# Patient Record
Sex: Female | Born: 1990 | Race: Asian | Hispanic: No | Marital: Single | Smoking: Never smoker
Health system: Southern US, Community
[De-identification: ages and names within clinical notes are randomized; demographics above are authoritative.]

## PROBLEM LIST (undated history)

## (undated) DIAGNOSIS — K219 Gastro-esophageal reflux disease without esophagitis: Secondary | ICD-10-CM

## (undated) DIAGNOSIS — K589 Irritable bowel syndrome without diarrhea: Secondary | ICD-10-CM

## (undated) DIAGNOSIS — Z789 Other specified health status: Secondary | ICD-10-CM

## (undated) DIAGNOSIS — R011 Cardiac murmur, unspecified: Secondary | ICD-10-CM

## (undated) DIAGNOSIS — Z98891 History of uterine scar from previous surgery: Secondary | ICD-10-CM

## (undated) HISTORY — DX: Irritable bowel syndrome, unspecified: K58.9

## (undated) HISTORY — PX: WISDOM TOOTH EXTRACTION: SHX21

## (undated) HISTORY — DX: Gastro-esophageal reflux disease without esophagitis: K21.9

## (undated) HISTORY — DX: Cardiac murmur, unspecified: R01.1

## (undated) HISTORY — PX: NO PAST SURGERIES: SHX2092

---

## 2012-02-19 ENCOUNTER — Ambulatory Visit (INDEPENDENT_AMBULATORY_CARE_PROVIDER_SITE_OTHER): Payer: Medicaid Other

## 2012-02-19 DIAGNOSIS — Z331 Pregnant state, incidental: Secondary | ICD-10-CM

## 2012-02-19 NOTE — Progress Notes (Signed)
New OB interview completed. New ob work up to be scheduled. Pt c/o some nausea but able to keep down some food.  Encouraged pt to eat small Meals throughout the day.   Chem 10 SG 1.015 PH 6 Protein Trace

## 2012-02-20 LAB — PRENATAL PANEL VII
HCT: 36.3 % (ref 36.0–46.0)
HIV: NONREACTIVE
Hemoglobin: 12.8 g/dL (ref 12.0–15.0)
Hepatitis B Surface Ag: NEGATIVE
Lymphocytes Relative: 24 % (ref 12–46)
Lymphs Abs: 1.5 10*3/uL (ref 0.7–4.0)
MCHC: 35.3 g/dL (ref 30.0–36.0)
Monocytes Absolute: 0.4 10*3/uL (ref 0.1–1.0)
Monocytes Relative: 6 % (ref 3–12)
Neutro Abs: 4.5 10*3/uL (ref 1.7–7.7)
Neutrophils Relative %: 70 % (ref 43–77)
RBC: 4.38 MIL/uL (ref 3.87–5.11)
Rh Type: POSITIVE
Rubella: 1.13 Index — ABNORMAL HIGH (ref <0.90)
WBC: 6.4 10*3/uL (ref 4.0–10.5)

## 2012-02-21 LAB — HEMOGLOBINOPATHY EVALUATION
Hgb A2 Quant: 2.4 % (ref 2.2–3.2)
Hgb F Quant: 0 % (ref 0.0–2.0)
Hgb S Quant: 0 %

## 2012-02-21 LAB — CULTURE, OB URINE: Colony Count: >=100000

## 2012-02-27 ENCOUNTER — Other Ambulatory Visit: Payer: Self-pay

## 2012-02-27 DIAGNOSIS — N390 Urinary tract infection, site not specified: Secondary | ICD-10-CM

## 2012-02-27 MED ORDER — CEPHALEXIN 500 MG PO CAPS: 500.0000 mg | ORAL_CAPSULE | Freq: Two times a day (BID) | ORAL | Status: DC

## 2012-03-18 ENCOUNTER — Encounter: Payer: Medicaid Other | Admitting: Obstetrics and Gynecology

## 2012-03-27 ENCOUNTER — Encounter: Payer: Medicaid Other | Admitting: Obstetrics and Gynecology

## 2012-04-02 ENCOUNTER — Telehealth: Payer: Self-pay | Admitting: Obstetrics and Gynecology

## 2012-04-02 NOTE — Telephone Encounter (Signed)
Pt called, is 19 weeks, states she has been having a pain on the right side of her abdomen since last pm.  Pt denies any bldg.  Today the pain is a little better but it is still there.  Pt admits to only drinking 1 bottle of water per day.  Pt advised that she needs to drink a minimum of 4 bottles or water per day or a minimum of 64 oz of water, whichever way she can get that in, take some Tylenol, try using a heating pad on a low setting, take a warm bath and call back if no relief.

## 2012-04-25 ENCOUNTER — Ambulatory Visit: Payer: Medicaid Other | Admitting: Certified Nurse Midwife

## 2012-04-25 ENCOUNTER — Encounter: Payer: Self-pay | Admitting: Certified Nurse Midwife

## 2012-04-25 VITALS — BP 90/58 | Ht 61.0 in | Wt 94.0 lb

## 2012-04-25 DIAGNOSIS — O2340 Unspecified infection of urinary tract in pregnancy, unspecified trimester: Secondary | ICD-10-CM | POA: Insufficient documentation

## 2012-04-25 DIAGNOSIS — Z3689 Encounter for other specified antenatal screening: Secondary | ICD-10-CM

## 2012-04-25 DIAGNOSIS — O2342 Unspecified infection of urinary tract in pregnancy, second trimester: Secondary | ICD-10-CM

## 2012-04-25 DIAGNOSIS — O0932 Supervision of pregnancy with insufficient antenatal care, second trimester: Secondary | ICD-10-CM

## 2012-04-25 DIAGNOSIS — O0930 Supervision of pregnancy with insufficient antenatal care, unspecified trimester: Secondary | ICD-10-CM | POA: Insufficient documentation

## 2012-04-25 DIAGNOSIS — Z331 Pregnant state, incidental: Secondary | ICD-10-CM

## 2012-04-25 LAB — POCT WET PREP (WET MOUNT)
Bacteria Wet Prep HPF POC: NEGATIVE
Clue Cells Wet Prep Whiff POC: NEGATIVE

## 2012-04-25 LAB — OB RESULTS CONSOLE GC/CHLAMYDIA: Chlamydia: NEGATIVE

## 2012-04-25 LAB — POCT URINALYSIS DIPSTICK
Ketones, UA: NEGATIVE
Leukocytes, UA: NEGATIVE
Protein, UA: NEGATIVE
Spec Grav, UA: 1.01
Urobilinogen, UA: NEGATIVE
pH, UA: 6

## 2012-04-25 MED ORDER — CITRANATAL ASSURE 35-1 & 300 MG PO MISC: 1.0000 | Freq: Every day | ORAL | Status: DC

## 2012-04-25 NOTE — Progress Notes (Signed)
[redacted]w[redacted]d Pt declines genetic screenings and flu shot Pt wants rx PNV's Citranatal Assure GC/CT and wet prep due today

## 2012-04-25 NOTE — Progress Notes (Signed)
Lindsey Hess is being seen today for her first obstetrical visit at [redacted]w[redacted]d gestation by LMP.  She reports early N/V which has not resolved.  Pt had a pos urine culture at NOB interview (1/7); rx done but pt did not know about it.  Will re-culture urine today and treat as appropriate.  Pt stated her desire for an epidural in labor.  Her obstetrical history is significant for: Patient Active Problem List  Diagnosis  . Late prenatal care  . 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  The following portions of the patient's history were reviewed and updated as appropriate: allergies, current medications, past family history, past medical history, past social history, past surgical history and problem list.  Review of Systems Pertinent ROS is described in HPI   Objective:   BP 90/58  Ht 5\' 1"  (1.549 m)  Wt 94 lb (42.638 kg)  BMI 17.77 kg/m2  LMP 11/19/2011 Wt Readings from Last 1 Encounters:  04/25/12 94 lb (42.638 kg)   BMI: Body mass index is 17.77 kg/(m^2).  General: alert, cooperative and no distress HEENT: grossly normal  Thyroid: normal  Respiratory: clear to auscultation bilaterally Cardiovascular: regular rate and rhythm,  Breasts:  No dominant masses, nipples erect Gastrointestinal: soft, non-tender; no masses,  no organomegaly Extremities: extremities normal, no pain or edema Vaginal Bleeding: None  EXTERNAL GENITALIA: normal appearing vulva with no masses, tenderness or lesions VAGINA: no abnormal discharge or lesions CERVIX: no lesions or cervical motion tenderness; cervix closed, long, firm UTERUS: gravid and consistent with 22 weeks ADNEXA: no masses palpable and nontender OB EXAM PELVIMETRY: appears adequate  FHR:  150  bpm  Assessment:   Pregnancy at   [redacted]w[redacted]d Late entry to care - NOB interview at 13 weeks but could not get NOB w/u until 22 weeks d/t medicaid difficulties UTI at NOB interview not treated - re-cultured today  Plan:   Prenatal panel reviewed and discussed with the patient:  yes  Pap smear collected:  Under 29 yo GC/Chlamydia collected:  yes Wet prep:  Neg  Discussion of Genetic testing options: Declines Prenatal vitamins recommended  Next visit:  4 weeks for ROB and glucola Other anticipated f/u:   Anatomy U/S to be scheduled as soon as possible   Haroldine Laws, CNM, MSN

## 2012-04-26 LAB — GC/CHLAMYDIA PROBE AMP
CT Probe RNA: NEGATIVE
GC Probe RNA: NEGATIVE

## 2012-04-27 LAB — CULTURE, OB URINE
Colony Count: NO GROWTH
Organism ID, Bacteria: NO GROWTH

## 2012-08-14 ENCOUNTER — Telehealth (HOSPITAL_COMMUNITY): Payer: Self-pay | Admitting: *Deleted

## 2012-08-14 ENCOUNTER — Encounter (HOSPITAL_COMMUNITY): Payer: Self-pay | Admitting: *Deleted

## 2012-08-14 NOTE — Telephone Encounter (Signed)
Preadmission screen  

## 2012-08-19 ENCOUNTER — Inpatient Hospital Stay (HOSPITAL_COMMUNITY)
Admission: RE | Admit: 2012-08-19 | Discharge: 2012-08-19 | DRG: 782 | Disposition: A | Discharge status: Home / Self Care | Payer: Medicaid Other | Source: Ambulatory Visit | Attending: Obstetrics and Gynecology | Admitting: Obstetrics and Gynecology

## 2012-08-19 ENCOUNTER — Encounter (HOSPITAL_COMMUNITY): Payer: Self-pay

## 2012-08-19 VITALS — BP 123/83 | HR 71 | Ht 61.0 in | Wt 106.0 lb

## 2012-08-19 DIAGNOSIS — IMO0001 Reserved for inherently not codable concepts without codable children: Secondary | ICD-10-CM | POA: Diagnosis not present

## 2012-08-19 DIAGNOSIS — O0932 Supervision of pregnancy with insufficient antenatal care, second trimester: Secondary | ICD-10-CM

## 2012-08-19 DIAGNOSIS — O321XX0 Maternal care for breech presentation, not applicable or unspecified: Secondary | ICD-10-CM | POA: Diagnosis present

## 2012-08-19 DIAGNOSIS — O2342 Unspecified infection of urinary tract in pregnancy, second trimester: Secondary | ICD-10-CM

## 2012-08-19 MED ORDER — TERBUTALINE SULFATE 1 MG/ML IJ SOLN
INTRAMUSCULAR | Status: AC
  Filled 2012-08-19: qty 1

## 2012-08-19 MED ORDER — LACTATED RINGERS IV SOLN: INTRAVENOUS | Status: DC

## 2012-08-19 MED ORDER — TERBUTALINE SULFATE 1 MG/ML IJ SOLN
0.2500 mg | Freq: Once | INTRAMUSCULAR | Status: AC
  Administered 2012-08-19: 0.25 mg via SUBCUTANEOUS

## 2012-08-19 NOTE — H&P (Addendum)
Admission History and Physical Exam for an Obstetrics Patient  Lindsey Hess is a 22 y.o. female, G1P0, at [redacted]w[redacted]d gestation, who presents for external version. She has been followed at the [facility] and Gynecology division of [facility].  Her pregnancy has been complicated by breech presentation. See history below.  OB History   Grav Para Term Preterm Abortions TAB SAB Ect Mult Living   1               No past medical history on file.  Prescriptions prior to admission  Medication Sig Dispense Refill  . cephALEXin (KEFLEX) 500 MG capsule Take 1 capsule (500 mg total) by mouth 2 (two) times daily.  14 capsule  0  . Pediatric Multiple Vit-C-FA (FLINSTONES GUMMIES OMEGA-3 DHA PO) Take by mouth.      . Prenat w/o A-FeCbGl-DSS-FA-DHA (CITRANATAL ASSURE) 35-1 & 300 MG tablet Take 1 tablet by mouth daily.  30 tablet  12    Past Surgical History  Procedure Laterality Date  . Wisdom tooth extraction      No Known Allergies  Family History: family history includes Cancer in her maternal grandmother and Diabetes in her maternal grandmother.  Social History:  reports that she has never smoked. She has never used smokeless tobacco. She reports that she does not drink alcohol or use illicit drugs.  Review of systems: Normal pregnancy complaints.  Admission Physical Exam:    There is no weight on file to calculate BMI.  Last menstrual period 11/19/2011.  HEENT:                 Within normal limits Chest:                   Clear Heart:                    Regular rate and rhythm Abdomen:             Gravid and nontender Extremities:          Grossly normal Neurologic exam: Grossly normal Pelvic exam:         Cervix: closed when last checked  Prenatal labs: ABO, Rh:             O/POS/-- (01/07 1405) HBsAg:                 NEGATIVE (01/07 1405)  HIV:                       NON REACTIVE (01/07 1405)  GBS:                     Negative (07/03  0000) Antibody:              NEG (01/07 1405) Rubella:                  RPR:                    NON REAC (01/07 1405)      Assessment:  [redacted]w[redacted]d gestation  Breech presentation  Plan:  External version. Risks and benefits discussed.   Sheritha Louis V 08/19/2012, 8:12 AM

## 2012-08-19 NOTE — Op Note (Signed)
OPERATIVE NOTE  Lindsey Hess  DOB:    [birth year]  MRN:    [id number]  CSN:    [id number]  Date of Surgery:  08/19/2012  Preoperative Diagnosis:  [redacted]w[redacted]d  Breech Presentation  Postoperative Diagnosis:  [redacted]w[redacted]d  Breech Presentation  Procedure:  Unsuccessful External Version  Surgeon:  Leonard Schwartz, M.D.  Assistant:  Haroldine Laws, CNM  Anesthetic:  None  Disposition:   The patient presents with an infant with a breech presentation documented by ultrasound. She denies bleeding and leakage of fluid. The treatment options were reviewed including observation, external version, and cesarean delivery. The risk and benefits were discussed. Questions were answered. The patient wants to proceed with an attempt at external version. She understands that fetal stress is a possibility, and that we may need to proceed today with cesarean delivery. She understands the indications for the procedure. She accepts the risk of, but not limited to, anesthetic complications, bleeding, infections, and possible damage to the surrounding organs.  Findings:  An ultrasound was performed and a breech presentation was confirmed. The amniotic fluid volume was normal. Normal fetal heart motion was documented. There was no evidence of an abruption. The fetal head is located in the RU quadrant. The patient's blood type is O+.  Procedure:  The patient was evaluated on the labor and delivery suite. The fetal heart rate tracing is a category 1. The procedure was carefully explained to the patient and her family members. The risk and benefits were reviewed. A permit was signed. The patient had a saline lock started. The patient was given 0.25 mg of subcutaneous terbutaline. An ultrasound was performed with findings as mentioned above. The patient's abdomen was covered with ultrasound gel. Two attempts were made to rotate the infant in a counterclockwise direction. These attempts were unsuccessful. One  attempt was made to rotate the infant in a clockwise direction. This attempt was also unsuccessful. The decision was made to discontinue any further attempts. The patient was monitored carefully with the ultrasound throughout her procedure. The was no evidence of fetal stress at any time. The patient was placed back on the nonstress test monitor for observation.   We will plan to schedule a cesarean delivery.  Leonard Schwartz, M.D.

## 2012-08-22 NOTE — Discharge Summary (Signed)
Discharge Summary for [facility] Obstetrical Patient  Lindsey Hess  DOB:    07/29/1990 MRN:    [id number] CSN:    [id number]  Date of admission:                  08/19/12  Date of discharge:                   08/19/12  Admission Diagnosis:  37w 5d gestation  Breech presentation  Discharge Diagnosis:  37w 5d gestation  Breech presentation  Procedures this admission:  Unsuccessful External Version  History of Present Illness:  Lindsey Hess is a 22 y.o. female, G1P0, who presents at [redacted]w[redacted]d weeks gestation. The patient has been followed at the [facility] and Gynecology division of [facility]. She was admitted for external version. Her pregnancy has been complicated by: a breech presentation at term.  Hospital course:  The patient was admitted for an ultrasound that confirmed a breech presentation and then an external version that was unsuccessful.   Her NST was reactive after her procedure. She was discharged to home doing well.  Discharge Physical Exam:  BP 123/83  Pulse 71  Ht 5\' 1"  (1.549 m)  Wt 106 lb (48.081 kg)  BMI 20.04 kg/m2  LMP 11/19/2011   General: no distress Resp: clear to auscultation bilaterally Cardio: regular rate and rhythm, S1, S2 normal, no murmur, click, rub or gallop GI: soft, non-tender; bowel sounds normal; no masses,  no organomegaly and Breech presentation confirmed.  Exam deferred.  Discharge Information:  Activity:           unrestricted Diet:                routine Medications: PNV and Tylenol as needed. Condition:      stable Instructions:  Call for problems Discharge to: home  Follow-up Information   Follow up with Janine Limbo, MD. (Please keep next appt, office will call to schedule c-section)    Contact information:   [address] [place] [address] [phone]        Janine Limbo 08/22/2012

## 2012-08-25 ENCOUNTER — Other Ambulatory Visit: Payer: Self-pay | Admitting: Obstetrics and Gynecology

## 2012-08-25 ENCOUNTER — Inpatient Hospital Stay (HOSPITAL_COMMUNITY): Admission: AD | Admit: 2012-08-25 | Payer: Self-pay | Source: Ambulatory Visit | Admitting: Obstetrics and Gynecology

## 2012-08-26 ENCOUNTER — Encounter (HOSPITAL_COMMUNITY): Payer: Self-pay

## 2012-08-26 ENCOUNTER — Encounter (HOSPITAL_COMMUNITY)
Admission: RE | Admit: 2012-08-26 | Discharge: 2012-08-26 | Disposition: A | Discharge status: Home / Self Care | Payer: Medicaid Other | Source: Ambulatory Visit | Attending: Obstetrics and Gynecology | Admitting: Obstetrics and Gynecology

## 2012-08-26 HISTORY — DX: Other specified health status: Z78.9

## 2012-08-26 LAB — CBC
Hemoglobin: 13.1 g/dL (ref 12.0–15.0)
Platelets: 202 10*3/uL (ref 150–400)
RBC: 4.37 MIL/uL (ref 3.87–5.11)
WBC: 7.1 10*3/uL (ref 4.0–10.5)

## 2012-08-26 LAB — TYPE AND SCREEN
ABO/RH(D): O POS
Antibody Screen: NEGATIVE

## 2012-08-26 LAB — ABO/RH: ABO/RH(D): O POS

## 2012-08-26 LAB — RPR: RPR Ser Ql: NONREACTIVE

## 2012-08-26 NOTE — Patient Instructions (Addendum)
21 Lindsey Hess  08/26/2012   Your procedure is scheduled on:  08/28/12  Enter through the Main Entrance of [facility] at 915 AM.  Pick up the phone at the desk and dial [phone].   Call this number if you have problems the morning of surgery: [phone]   Remember:   Do not eat food:After Midnight.  Do not drink clear liquids: After Midnight.  Take these medicines the morning of surgery with A SIP OF WATER: NA   Do not wear jewelry, make-up or nail polish.  Do not wear lotions, powders, or perfumes. You may wear deodorant.  Do not shave 48 hours prior to surgery.  Do not bring valuables to the hospital.  [facility] is not responsible                  for any belongings or valuables brought to the hospital.  Contacts, dentures or bridgework may not be worn into surgery.  Leave suitcase in the car. After surgery it may be brought to your room.  For patients admitted to the hospital, checkout time is 11:00 AM the day of                discharge.   Patients discharged the day of surgery will not be allowed to drive                   home.  Name and phone number of your driver: NA  Special Instructions: Shower using CHG 2 nights before surgery and the night before surgery.  If you shower the day of surgery use CHG.  Use special wash - you have one bottle of CHG for all showers.  You should use approximately 1/3 of the bottle for each shower.   Please read over the following fact sheets that you were given: Surgical Site Infection Prevention

## 2012-08-28 ENCOUNTER — Encounter (HOSPITAL_COMMUNITY)
Admission: RE | Disposition: A | Discharge status: Home / Self Care | Payer: Self-pay | Source: Ambulatory Visit | Attending: Obstetrics and Gynecology

## 2012-08-28 ENCOUNTER — Inpatient Hospital Stay (HOSPITAL_COMMUNITY): Payer: Medicaid Other | Admitting: Anesthesiology

## 2012-08-28 ENCOUNTER — Encounter (HOSPITAL_COMMUNITY): Payer: Self-pay | Admitting: Anesthesiology

## 2012-08-28 ENCOUNTER — Inpatient Hospital Stay (HOSPITAL_COMMUNITY)
Admission: RE | Admit: 2012-08-28 | Discharge: 2012-08-31 | DRG: 766 | Disposition: A | Discharge status: Home / Self Care | Payer: Medicaid Other | Source: Ambulatory Visit | Attending: Obstetrics and Gynecology | Admitting: Obstetrics and Gynecology

## 2012-08-28 DIAGNOSIS — D649 Anemia, unspecified: Secondary | ICD-10-CM | POA: Diagnosis not present

## 2012-08-28 DIAGNOSIS — O0931 Supervision of pregnancy with insufficient antenatal care, first trimester: Secondary | ICD-10-CM

## 2012-08-28 DIAGNOSIS — O9903 Anemia complicating the puerperium: Secondary | ICD-10-CM | POA: Diagnosis not present

## 2012-08-28 DIAGNOSIS — Z98891 History of uterine scar from previous surgery: Secondary | ICD-10-CM

## 2012-08-28 DIAGNOSIS — O321XX0 Maternal care for breech presentation, not applicable or unspecified: Principal | ICD-10-CM | POA: Diagnosis present

## 2012-08-28 HISTORY — DX: History of uterine scar from previous surgery: Z98.891

## 2012-08-28 SURGERY — Surgical Case
Anesthesia: Spinal | Site: Abdomen | Wound class: Clean Contaminated

## 2012-08-28 MED ORDER — DIPHENHYDRAMINE HCL 25 MG PO CAPS
25.0000 mg | ORAL_CAPSULE | Freq: Four times a day (QID) | ORAL | Status: DC | PRN
  Filled 2012-08-28: qty 1

## 2012-08-28 MED ORDER — FENTANYL CITRATE 0.05 MG/ML IJ SOLN
INTRAMUSCULAR | Status: DC | PRN
  Administered 2012-08-28: 12.5 ug via INTRAVENOUS
  Administered 2012-08-28: 12.5 ug via INTRATHECAL

## 2012-08-28 MED ORDER — EPHEDRINE 5 MG/ML INJ
INTRAVENOUS | Status: AC
  Filled 2012-08-28: qty 10

## 2012-08-28 MED ORDER — PROMETHAZINE HCL 25 MG/ML IJ SOLN: 6.2500 mg | INTRAMUSCULAR | Status: DC | PRN

## 2012-08-28 MED ORDER — SENNOSIDES-DOCUSATE SODIUM 8.6-50 MG PO TABS
2.0000 | ORAL_TABLET | Freq: Every day | ORAL | Status: DC
  Administered 2012-08-28 – 2012-08-30 (×3): 2 via ORAL

## 2012-08-28 MED ORDER — MEPERIDINE HCL 25 MG/ML IJ SOLN: 6.2500 mg | INTRAMUSCULAR | Status: DC | PRN

## 2012-08-28 MED ORDER — DIPHENHYDRAMINE HCL 50 MG/ML IJ SOLN: 25.0000 mg | INTRAMUSCULAR | Status: DC | PRN

## 2012-08-28 MED ORDER — ACETAMINOPHEN 10 MG/ML IV SOLN
1000.0000 mg | Freq: Four times a day (QID) | INTRAVENOUS | Status: AC | PRN
  Filled 2012-08-28: qty 100

## 2012-08-28 MED ORDER — NALBUPHINE SYRINGE 5 MG/0.5 ML
5.0000 mg | INJECTION | INTRAMUSCULAR | Status: DC | PRN
  Administered 2012-08-29: 5 mg via INTRAVENOUS
  Filled 2012-08-28 (×2): qty 1

## 2012-08-28 MED ORDER — WITCH HAZEL-GLYCERIN EX PADS: 1.0000 "application " | MEDICATED_PAD | CUTANEOUS | Status: DC | PRN

## 2012-08-28 MED ORDER — ONDANSETRON HCL 4 MG/2ML IJ SOLN: 4.0000 mg | INTRAMUSCULAR | Status: DC | PRN

## 2012-08-28 MED ORDER — SCOPOLAMINE 1 MG/3DAYS TD PT72: 1.0000 | MEDICATED_PATCH | Freq: Once | TRANSDERMAL | Status: DC

## 2012-08-28 MED ORDER — LACTATED RINGERS IV SOLN
INTRAVENOUS | Status: DC
  Administered 2012-08-29 (×2): via INTRAVENOUS

## 2012-08-28 MED ORDER — LACTATED RINGERS IV SOLN
INTRAVENOUS | Status: DC | PRN
  Administered 2012-08-28 (×4): via INTRAVENOUS

## 2012-08-28 MED ORDER — METOCLOPRAMIDE HCL 5 MG/ML IJ SOLN: 10.0000 mg | Freq: Three times a day (TID) | INTRAMUSCULAR | Status: DC | PRN

## 2012-08-28 MED ORDER — PRENATAL MULTIVITAMIN CH
1.0000 | ORAL_TABLET | Freq: Every day | ORAL | Status: DC
  Administered 2012-08-29 – 2012-08-30 (×2): 1 via ORAL
  Filled 2012-08-28 (×4): qty 1

## 2012-08-28 MED ORDER — FENTANYL CITRATE 0.05 MG/ML IJ SOLN
INTRAMUSCULAR | Status: AC
  Filled 2012-08-28: qty 2

## 2012-08-28 MED ORDER — NALBUPHINE SYRINGE 5 MG/0.5 ML
5.0000 mg | INJECTION | INTRAMUSCULAR | Status: DC | PRN
Start: 2012-08-28 — End: 2012-08-31
  Administered 2012-08-28: 5 mg via SUBCUTANEOUS
  Filled 2012-08-28: qty 0.5
  Filled 2012-08-28: qty 1

## 2012-08-28 MED ORDER — MENTHOL 3 MG MT LOZG
1.0000 | LOZENGE | OROMUCOSAL | Status: DC | PRN
  Filled 2012-08-28: qty 9

## 2012-08-28 MED ORDER — LANOLIN HYDROUS EX OINT: 1.0000 "application " | TOPICAL_OINTMENT | CUTANEOUS | Status: DC | PRN

## 2012-08-28 MED ORDER — MORPHINE SULFATE 0.5 MG/ML IJ SOLN
INTRAMUSCULAR | Status: AC
  Filled 2012-08-28: qty 10

## 2012-08-28 MED ORDER — MORPHINE SULFATE (PF) 0.5 MG/ML IJ SOLN
INTRAMUSCULAR | Status: DC | PRN
  Administered 2012-08-28: 200 ug via INTRATHECAL

## 2012-08-28 MED ORDER — MEDROXYPROGESTERONE ACETATE 150 MG/ML IM SUSP: 150.0000 mg | INTRAMUSCULAR | Status: DC | PRN

## 2012-08-28 MED ORDER — NALOXONE HCL 1 MG/ML IJ SOLN
1.0000 ug/kg/h | INTRAVENOUS | Status: DC | PRN
  Filled 2012-08-28: qty 2

## 2012-08-28 MED ORDER — DIPHENHYDRAMINE HCL 25 MG PO CAPS
25.0000 mg | ORAL_CAPSULE | ORAL | Status: DC | PRN
  Filled 2012-08-28: qty 1

## 2012-08-28 MED ORDER — PHENYLEPHRINE HCL 10 MG/ML IJ SOLN
INTRAMUSCULAR | Status: DC | PRN
  Administered 2012-08-28 (×7): 40 ug via INTRAVENOUS
  Administered 2012-08-28: 80 ug via INTRAVENOUS
  Administered 2012-08-28: 40 ug via INTRAVENOUS

## 2012-08-28 MED ORDER — ONDANSETRON HCL 4 MG/2ML IJ SOLN: 4.0000 mg | Freq: Three times a day (TID) | INTRAMUSCULAR | Status: DC | PRN

## 2012-08-28 MED ORDER — SCOPOLAMINE 1 MG/3DAYS TD PT72
MEDICATED_PATCH | TRANSDERMAL | Status: AC
  Administered 2012-08-28: 1.5 mg via TRANSDERMAL
  Filled 2012-08-28: qty 1

## 2012-08-28 MED ORDER — SIMETHICONE 80 MG PO CHEW
80.0000 mg | CHEWABLE_TABLET | Freq: Three times a day (TID) | ORAL | Status: DC
  Administered 2012-08-28 – 2012-08-31 (×10): 80 mg via ORAL

## 2012-08-28 MED ORDER — DIBUCAINE 1 % RE OINT
1.0000 "application " | TOPICAL_OINTMENT | RECTAL | Status: DC | PRN
  Filled 2012-08-28: qty 28

## 2012-08-28 MED ORDER — KETOROLAC TROMETHAMINE 30 MG/ML IJ SOLN
30.0000 mg | Freq: Four times a day (QID) | INTRAMUSCULAR | Status: AC | PRN
  Administered 2012-08-28: 30 mg via INTRAVENOUS
  Filled 2012-08-28: qty 1

## 2012-08-28 MED ORDER — NALOXONE HCL 0.4 MG/ML IJ SOLN: 0.4000 mg | INTRAMUSCULAR | Status: DC | PRN

## 2012-08-28 MED ORDER — ONDANSETRON HCL 4 MG/2ML IJ SOLN
INTRAMUSCULAR | Status: AC
  Filled 2012-08-28: qty 2

## 2012-08-28 MED ORDER — TETANUS-DIPHTH-ACELL PERTUSSIS 5-2.5-18.5 LF-MCG/0.5 IM SUSP
0.5000 mL | Freq: Once | INTRAMUSCULAR | Status: AC
  Administered 2012-08-29: 0.5 mL via INTRAMUSCULAR
  Filled 2012-08-28 (×2): qty 0.5

## 2012-08-28 MED ORDER — LACTATED RINGERS IV SOLN
40.0000 [IU] | INTRAVENOUS | Status: DC | PRN
  Administered 2012-08-28: 40 [IU] via INTRAVENOUS

## 2012-08-28 MED ORDER — BUPIVACAINE IN DEXTROSE 0.75-8.25 % IT SOLN
INTRATHECAL | Status: DC | PRN
  Administered 2012-08-28: 10.5 mg via INTRATHECAL

## 2012-08-28 MED ORDER — OXYCODONE-ACETAMINOPHEN 5-325 MG PO TABS
1.0000 | ORAL_TABLET | ORAL | Status: DC | PRN
  Administered 2012-08-29 – 2012-08-30 (×3): 1 via ORAL
  Administered 2012-08-30 – 2012-08-31 (×3): 2 via ORAL
  Administered 2012-08-31: 1 via ORAL
  Filled 2012-08-28: qty 2
  Filled 2012-08-28: qty 1
  Filled 2012-08-28: qty 2
  Filled 2012-08-28: qty 1
  Filled 2012-08-28: qty 2
  Filled 2012-08-28 (×2): qty 1
  Filled 2012-08-28: qty 2

## 2012-08-28 MED ORDER — CEFAZOLIN SODIUM-DEXTROSE 2-3 GM-% IV SOLR
INTRAVENOUS | Status: AC
  Filled 2012-08-28: qty 50

## 2012-08-28 MED ORDER — FENTANYL CITRATE 0.05 MG/ML IJ SOLN: 25.0000 ug | INTRAMUSCULAR | Status: DC | PRN

## 2012-08-28 MED ORDER — SIMETHICONE 80 MG PO CHEW: 80.0000 mg | CHEWABLE_TABLET | ORAL | Status: DC | PRN

## 2012-08-28 MED ORDER — MIDAZOLAM HCL 2 MG/2ML IJ SOLN: 0.5000 mg | Freq: Once | INTRAMUSCULAR | Status: DC | PRN

## 2012-08-28 MED ORDER — KETOROLAC TROMETHAMINE 30 MG/ML IJ SOLN: 30.0000 mg | Freq: Four times a day (QID) | INTRAMUSCULAR | Status: AC | PRN

## 2012-08-28 MED ORDER — DIPHENHYDRAMINE HCL 50 MG/ML IJ SOLN: 12.5000 mg | INTRAMUSCULAR | Status: DC | PRN

## 2012-08-28 MED ORDER — KETOROLAC TROMETHAMINE 30 MG/ML IJ SOLN
INTRAMUSCULAR | Status: AC
  Administered 2012-08-28: 30 mg via INTRAVENOUS
  Filled 2012-08-28: qty 1

## 2012-08-28 MED ORDER — OXYTOCIN 40 UNITS IN LACTATED RINGERS INFUSION - SIMPLE MED: 62.5000 mL/h | INTRAVENOUS | Status: AC

## 2012-08-28 MED ORDER — LACTATED RINGERS IV SOLN
Freq: Once | INTRAVENOUS | Status: AC
  Administered 2012-08-28: 10:00:00 via INTRAVENOUS

## 2012-08-28 MED ORDER — SODIUM CHLORIDE 0.9 % IJ SOLN: 3.0000 mL | INTRAMUSCULAR | Status: DC | PRN

## 2012-08-28 MED ORDER — CEFAZOLIN SODIUM-DEXTROSE 2-3 GM-% IV SOLR
2.0000 g | INTRAVENOUS | Status: AC
  Administered 2012-08-28: 2 g via INTRAVENOUS

## 2012-08-28 MED ORDER — ONDANSETRON HCL 4 MG/2ML IJ SOLN
INTRAMUSCULAR | Status: DC | PRN
  Administered 2012-08-28: 4 mg via INTRAVENOUS

## 2012-08-28 MED ORDER — ONDANSETRON HCL 4 MG PO TABS: 4.0000 mg | ORAL_TABLET | ORAL | Status: DC | PRN

## 2012-08-28 MED ORDER — PHENYLEPHRINE 40 MCG/ML (10ML) SYRINGE FOR IV PUSH (FOR BLOOD PRESSURE SUPPORT)
PREFILLED_SYRINGE | INTRAVENOUS | Status: AC
  Filled 2012-08-28: qty 5

## 2012-08-28 MED ORDER — ZOLPIDEM TARTRATE 5 MG PO TABS: 5.0000 mg | ORAL_TABLET | Freq: Every evening | ORAL | Status: DC | PRN

## 2012-08-28 MED ORDER — IBUPROFEN 600 MG PO TABS
600.0000 mg | ORAL_TABLET | Freq: Four times a day (QID) | ORAL | Status: DC
  Administered 2012-08-29 – 2012-08-31 (×9): 600 mg via ORAL
  Filled 2012-08-28 (×9): qty 1

## 2012-08-28 MED ORDER — OXYTOCIN 10 UNIT/ML IJ SOLN
INTRAMUSCULAR | Status: AC
  Filled 2012-08-28: qty 4

## 2012-08-28 SURGICAL SUPPLY — 34 items
BENZOIN TINCTURE PRP APPL 2/3 (GAUZE/BANDAGES/DRESSINGS) ×2 IMPLANT
CLAMP CORD UMBIL (MISCELLANEOUS) IMPLANT
CLOTH BEACON ORANGE TIMEOUT ST (SAFETY) ×2 IMPLANT
CONTAINER PREFILL 10% NBF 15ML (MISCELLANEOUS) IMPLANT
DRAPE LG THREE QUARTER DISP (DRAPES) ×2 IMPLANT
DRSG OPSITE POSTOP 4X10 (GAUZE/BANDAGES/DRESSINGS) ×2 IMPLANT
DURAPREP 26ML APPLICATOR (WOUND CARE) ×2 IMPLANT
ELECT REM PT RETURN 9FT ADLT (ELECTROSURGICAL) ×2
ELECTRODE REM PT RTRN 9FT ADLT (ELECTROSURGICAL) ×1 IMPLANT
EXTRACTOR VACUUM M CUP 4 TUBE (SUCTIONS) IMPLANT
GLOVE BIO SURGEON STRL SZ7.5 (GLOVE) ×2 IMPLANT
GLOVE BIOGEL PI IND STRL 7.5 (GLOVE) ×1 IMPLANT
GLOVE BIOGEL PI INDICATOR 7.5 (GLOVE) ×1
GOWN STRL REIN XL XLG (GOWN DISPOSABLE) ×8 IMPLANT
KIT ABG SYR 3ML LUER SLIP (SYRINGE) IMPLANT
NEEDLE HYPO 25X5/8 SAFETYGLIDE (NEEDLE) IMPLANT
NS IRRIG 1000ML POUR BTL (IV SOLUTION) ×2 IMPLANT
PACK C SECTION WH (CUSTOM PROCEDURE TRAY) ×2 IMPLANT
PAD OB MATERNITY 4.3X12.25 (PERSONAL CARE ITEMS) ×2 IMPLANT
RETRACTOR WND ALEXIS 25 LRG (MISCELLANEOUS) ×1 IMPLANT
RTRCTR WOUND ALEXIS 25CM LRG (MISCELLANEOUS) ×2
STRIP CLOSURE SKIN 1/2X4 (GAUZE/BANDAGES/DRESSINGS) ×2 IMPLANT
SUT CHROMIC 2 0 CT 1 (SUTURE) ×2 IMPLANT
SUT MNCRL AB 3-0 PS2 27 (SUTURE) ×2 IMPLANT
SUT PLAIN 0 NONE (SUTURE) IMPLANT
SUT PLAIN 2 0 XLH (SUTURE) ×2 IMPLANT
SUT VIC AB 0 CT1 27 (SUTURE) ×1
SUT VIC AB 0 CT1 27XBRD ANBCTR (SUTURE) ×1 IMPLANT
SUT VIC AB 0 CT1 36 (SUTURE) ×2 IMPLANT
SUT VIC AB 0 CTX 36 (SUTURE) ×3
SUT VIC AB 0 CTX36XBRD ANBCTRL (SUTURE) ×3 IMPLANT
TOWEL OR 17X24 6PK STRL BLUE (TOWEL DISPOSABLE) ×6 IMPLANT
TRAY FOLEY CATH 14FR (SET/KITS/TRAYS/PACK) ×2 IMPLANT
WATER STERILE IRR 1000ML POUR (IV SOLUTION) ×2 IMPLANT

## 2012-08-28 NOTE — H&P (Signed)
Lindsey Hess is a 22 y.o. female presenting for c-section secondary to breech presentation.    History OB History   Grav Para Term Preterm Abortions TAB SAB Ect Mult Living   1              Past Medical History  Diagnosis Date  . Medical history non-contributory    Past Surgical History  Procedure Laterality Date  . Wisdom tooth extraction    . No past surgeries     Family History: family history includes Cancer in her maternal grandmother and Diabetes in her maternal grandmother. Social History:  reports that she has never smoked. She has never used smokeless tobacco. She reports that she does not drink alcohol or use illicit drugs.   Prenatal Transfer Tool  Maternal Diabetes: No Genetic Screening: no results documented Maternal Ultrasounds/Referrals: Normal Fetal Ultrasounds or other Referrals:  None Maternal Substance Abuse:  No Significant Maternal Medications:  None Significant Maternal Lab Results:  Lab values include: Group B Strep negative Other Comments:  failed version beginning of July  ROS    Blood pressure 129/91, pulse 87, temperature 98.2 F (36.8 C), temperature source Oral, resp. rate 18, last menstrual period 11/19/2011, SpO2 100.00%. Exam Physical Exam   Lungs CTA CV RRR Abd gravid Ext no calf tenderness  Prenatal labs: ABO, Rh: --/--/O POS, O POS (07/15 1200) Antibody: NEG (07/15 1200) Rubella: 1.13 (01/07 1405) RPR: NON REACTIVE (07/15 1200)  HBsAg: NEGATIVE (01/07 1405)  HIV: NON REACTIVE (01/07 1405)  GBS: Negative (07/03 0000)   Assessment/Plan: P0 at 39wks for Primary c-section secondary to breech presentation after unsuccessful ECV on 7/8.  R/B/A discussed, consent signe and witnessed.    Lindsey Hess Y 08/28/2012, 10:13 AM

## 2012-08-28 NOTE — Anesthesia Preprocedure Evaluation (Signed)

## 2012-08-28 NOTE — Anesthesia Postprocedure Evaluation (Signed)
Anesthesia Post Note  Patient: Lindsey Hess  Procedure(s) Performed: Procedure(s) (LRB): CESAREAN SECTION (N/A)  Anesthesia type: Spinal  Patient location: PACU  Post pain: Pain level controlled  Post assessment: Post-op Vital signs reviewed  Last Vitals:  Filed Vitals:   08/28/12 1330  BP: 122/84  Pulse: 56  Temp:   Resp: 19    Post vital signs: Reviewed  Level of consciousness: awake  Complications: No apparent anesthesia complications

## 2012-08-28 NOTE — Anesthesia Procedure Notes (Signed)
Spinal  Patient location during procedure: OR Start time: 08/28/2012 10:44 AM End time: 08/28/2012 10:46 AM Staffing Anesthesiologist: Sandrea Hughs Performed by: anesthesiologist  Preanesthetic Checklist Completed: patient identified, surgical consent, pre-op evaluation, timeout performed, IV checked, risks and benefits discussed and monitors and equipment checked Spinal Block Patient position: sitting Prep: DuraPrep Patient monitoring: heart rate, cardiac monitor, continuous pulse ox and blood pressure Approach: midline Location: L3-4 Injection technique: single-shot Needle Needle type: Sprotte  Needle gauge: 24 G Needle length: 9 cm Needle insertion depth: 5 cm Assessment Sensory level: T4

## 2012-08-28 NOTE — Consult Note (Signed)
Neonatology Note:  Attendance at C-section:  I was asked by Dr. Roberts to attend this primary C/S at term due to breech presentation. The mother is a G1P0 O pos, GBS neg with PNC starting at 13 weeks. ROM at delivery, fluid clear. Infant vigorous with good spontaneous cry and tone. Needed only minimal bulb suctioning. Ap 8/9. Lungs clear to ausc in DR. To CN to care of Pediatrician.  Lindsey Hess C. Cori Henningsen, MD  

## 2012-08-28 NOTE — Anesthesia Postprocedure Evaluation (Signed)
  Anesthesia Post-op Note  Anesthesia Post Note  Patient: Lindsey Hess  Procedure(s) Performed: Procedure(s) (LRB): CESAREAN SECTION (N/A)  Anesthesia type: Spinal  Patient location: Mother/Baby  Post pain: Pain level controlled  Post assessment: Post-op Vital signs reviewed  Last Vitals:  Filed Vitals:   08/28/12 1744  BP: 127/87  Pulse: 85  Temp: 36.8 C  Resp: 18    Post vital signs: Reviewed  Level of consciousness: awake  Complications: No apparent anesthesia complications

## 2012-08-28 NOTE — Op Note (Signed)
Cesarean Section Procedure Note  Indications: P0 at 39wks for elective c-section secondary to breech presentation.  Pre-operative Diagnosis: Breech Presentation; CPT [phone]   Post-operative Diagnosis: Breech Presentation; CPT [phone]  Procedure: CESAREAN SECTION  Surgeon: Purcell Nails, MD    Assistants: Sanda Klein, CNM  Anesthesia: Regional  Anesthesiologist: Arby Barrette, MD  Procedure Details  The patient was taken to the operating room secondary to primary c-section for breech after the risks, benefits, complications, treatment options, and expected outcomes were discussed with the patient.  The patient concurred with the proposed plan, giving informed consent which was signed and witnessed. The patient was taken to Operating Room C-Section Suite, identified as Lindsey Hess and the procedure verified as C-Section Delivery. A Time Out was held and the above information confirmed.  After induction of anesthesia by obtaining a spinal, the patient was prepped and draped in the usual sterile manner. A Pfannenstiel skin incision was made and carried down through the subcutaneous tissue to the underlying layer of fascia.  The fascia was incised bilaterally and extended transversely bilaterally with the Mayo scissors. Kocher clamps were placed on the inferior aspect of the fascial incision and the underlying rectus muscle was separated from the fascia. The same was done on the superior aspect of the fascial incision.  The peritoneum was identified, entered bluntly and extended manually. The utero-vesical peritoneal reflection was incised transversely and the bladder flap was bluntly freed from the lower uterine segment. A low transverse uterine incision was made with the scalpel and extended bilaterally with the bandage scissors.  The infant was delivered in vertex position without difficulty.  After the umbilical cord was clamped and cut, the infant was handed to the awaiting pediatricians.  Cord  blood was obtained for evaluation.  The placenta was removed intact and appeared to be within normal limits. The uterus was cleared of all clots and debris. The uterine incision was closed with running interlocking sutures of 0 Vicryl and a second imbricating layer was performed as well.   Bilateral tubes and ovaries appeared to be within normal limits.  Good hemostasis was noted.  Copious irrigation was performed until clear.  The peritoneum was repaired with 2-0 chromic via a running suture.  The fascia was reapproximated with a running suture of 0 Vicryl. The subcutaneous tissue was reapproximated with 3 interrupted sutures of 2-0 plain.  The skin was reapproximated with a subcuticular suture of 3-0 monocryl.  Steristrips were applied.  Instrument, sponge, and needle counts were correct prior to abdominal closure and at the conclusion of the case.  The patient was awaiting transfer to the recovery room in good condition.  Findings: Live female infant with Apgars 8 at one minute and 9 at five minutes.  Normal appearing bilateral ovaries and fallopian tubes were noted.  Estimated Blood Loss:  700 ml         Drains: foley to gravity 175 cc         Total IV Fluids: 3500 ml         Specimens to Pathology: Placenta         Complications:  None; patient tolerated the procedure well.         Disposition: PACU - hemodynamically stable.         Condition: stable  Attending Attestation: I performed the procedure.

## 2012-08-28 NOTE — Transfer of Care (Signed)
Immediate Anesthesia Transfer of Care Note  Patient: Lindsey Hess  Procedure(s) Performed: Procedure(s): CESAREAN SECTION (N/A)  Patient Location: PACU  Anesthesia Type:Spinal  Level of Consciousness: awake, alert  and oriented  Airway & Oxygen Therapy: Patient Spontanous Breathing  Post-op Assessment: Report given to PACU RN and Post -op Vital signs reviewed and stable  Post vital signs: Reviewed and stable  Complications: No apparent anesthesia complications

## 2012-08-29 ENCOUNTER — Encounter (HOSPITAL_COMMUNITY): Payer: Self-pay

## 2012-08-29 LAB — CBC
MCH: 30 pg (ref 26.0–34.0)
MCHC: 34.4 g/dL (ref 30.0–36.0)
Platelets: 166 10*3/uL (ref 150–400)

## 2012-08-29 LAB — BIRTH TISSUE RECOVERY COLLECTION (PLACENTA DONATION)

## 2012-08-29 NOTE — Progress Notes (Signed)
Subjective: Postpartum Day 1: Cesarean Delivery Patient reports incisional pain, tolerating PO and + flatus.  Foley out around 0530; no void yet.  IVF still infusing.  Hasn't eaten regular diet yet.  Formula-feeding thus far, but rec'd lactating.  Husband at bs holding newborn.  Pt reports adequate sleep overnight.  Pain "4/10" at incision this AM.    Objective: Vital signs in last 24 hours: Temp:  [97.5 F (36.4 C)-98.5 F (36.9 C)] 97.7 F (36.5 C) (07/18 0800) Pulse Rate:  [51-95] 62 (07/18 0800) Resp:  [16-22] 16 (07/18 0800) BP: (97-134)/(63-91) 108/71 mmHg (07/18 0800) SpO2:  [97 %-100 %] 99 % (07/18 0800) Weight:  [110 lb (49.896 kg)] 110 lb (49.896 kg) (07/17 1401)  Physical Exam:  General: alert, appears stated age, fatigued and no distress Lochia: appropriate, minimal Abdomen: appropriately tender Uterine Fundus: firm at umbilicus Incision: post-op pressure dsg c/d/i DVT Evaluation: No evidence of DVT seen on physical exam. Negative Homan's sign. No significant calf/ankle edema.   Recent Labs  08/26/12 1200 08/29/12 0554  HGB 13.1 10.5*  HCT 38.1 30.5*    Assessment/Plan: Status post Cesarean section for breech. Doing well postoperatively.  Continue current care.  Poppy Mcafee H 08/29/2012, 8:31 AM

## 2012-08-29 NOTE — Progress Notes (Signed)
UR chart review completed.  

## 2012-08-30 NOTE — Progress Notes (Signed)
Subjective: Postpartum Day 2: Cesarean Delivery due to breech Patient up ad lib, reports no syncope or dizziness. Reports burning at incisional site.  + flatus, reported may have been able to have a BM yesterday was "too scared" Feeding:  Bottle Contraceptive plan:  Depo  Objective: Vital signs in last 24 hours: Temp:  [98.1 F (36.7 C)-98.8 F (37.1 C)] 98.8 F (37.1 C) (07/19 0900) Pulse Rate:  [60-68] 64 (07/19 0900) Resp:  [18-20] 20 (07/19 0900) BP: (111-130)/(69-86) 123/86 mmHg (07/19 0900)  Physical Exam:  General: alert, cooperative and no distress Lochia: appropriate Uterine Fundus: firm Incision: Serosanguinous drainage on about 1/3 of honeycomb dressing.  RN marked drainage this morning.  Will CTO. DVT Evaluation: No evidence of DVT seen on physical exam. Negative Homan's sign. JP drain:   n/a   Recent Labs  08/29/12 0554  HGB 10.5*  HCT 30.5*    Assessment/Plan: Status post Cesarean section day 2. Mild anemia Doing well postoperatively.  Continue current care. CTO incisional drainage Plan for discharge tomorrow    Lindsey Hess, Lindsey Hess 08/30/2012, 10:06 AM

## 2012-08-31 MED ORDER — IBUPROFEN 600 MG PO TABS: 600.0000 mg | ORAL_TABLET | Freq: Four times a day (QID) | ORAL | Status: DC

## 2012-08-31 MED ORDER — OXYCODONE-ACETAMINOPHEN 5-325 MG PO TABS: 1.0000 | ORAL_TABLET | ORAL | Status: DC | PRN

## 2012-08-31 MED ORDER — POLYSACCHARIDE IRON COMPLEX 150 MG PO CAPS: 150.0000 mg | ORAL_CAPSULE | Freq: Two times a day (BID) | ORAL | Status: DC

## 2012-08-31 NOTE — Discharge Summary (Signed)
Cesarean Section Delivery Discharge Summary  Lindsey Hess  DOB:    10/26/1990 MRN:    [id number] CSN:    [id number]  Date of admission:                  08/28/12  Date of discharge:                   08/31/12  Procedures this admission: C/S for breech presentation  Date of Delivery: 08/28/12  Newborn Data:  Live born female  Birth Weight: 6 lb 14.1 oz (3120 g) APGAR: 8, 9  Home with mother. Name: "Lindsey Hess" Circumcision Plan: n/a  History of Present Illness:  Ms. Lindsey Hess is a 22 y.o. female, G1P1000, who presents at [redacted]w[redacted]d weeks gestation. The patient has been followed at the [facility] and Gy[facility]cology division of [facility].    Her pregnancy has been complicated by:  Patient Active Problem List   Diagnosis Date Noted  . Status post cesarean section 08/28/2012  . Cephalic version 13/09/6576  . Breech presentation 08/19/2012  . Late prenatal care 04/25/2012  . UTI (urinary tract infection) during pregnancy 04/25/2012     Hospital course:  The patient was admitted for c/s for breech presentation.   Her postpartum course was not complicated.  She was discharged to home on postpartum day 3 doing well.  Feeding:  bottle  Contraception:  Depo-Provera to be given prior to discharge  Discharge hemoglobin:  Hemoglobin  Date Value Range Status  08/29/2012 10.5* 12.0 - 15.0 g/dL Final     HCT  Date Value Range Status  08/29/2012 30.5* 36.0 - 46.0 % Final    Discharge Physical Exam:   General: alert, cooperative and no distress Lochia: appropriate Uterine Fundus: firm Incision: healing well DVT Evaluation: No evidence of DVT seen on physical exam. Negative Homan's sign.  Intrapartum Procedures: cesarean: low cervical, transverse Postpartum Procedures: none Complications-Operative and Postpartum: none  Discharge Diagnoses: Term Pregnancy-delivered     anemia  Discharge Information:  Activity:           pelvic  rest Diet:                routine Medications: PNV, Ibuprofen and Percocet and iron Condition:      stable Instructions:  refer to practice specific booklet Discharge to: home  Follow-up Information   Follow up with [facility]. Schedule an appointment as soon as possible for a visit in 6 weeks. (Keep already scheduled appointment.  Call with any questions or concerns)    Contact information:   [address] [place] [address] [phone]       Lindsey Hess, Lindsey Hess 08/31/2012

## 2012-09-02 ENCOUNTER — Inpatient Hospital Stay (HOSPITAL_COMMUNITY)
Admission: AD | Admit: 2012-09-02 | Discharge: 2012-09-02 | Disposition: A | Discharge status: Home / Self Care | Payer: Medicaid Other | Source: Ambulatory Visit | Attending: Obstetrics and Gynecology | Admitting: Obstetrics and Gynecology

## 2012-09-02 ENCOUNTER — Encounter (HOSPITAL_COMMUNITY): Payer: Self-pay | Admitting: *Deleted

## 2012-09-02 DIAGNOSIS — R5381 Other malaise: Secondary | ICD-10-CM

## 2012-09-02 DIAGNOSIS — O2343 Unspecified infection of urinary tract in pregnancy, third trimester: Secondary | ICD-10-CM

## 2012-09-02 DIAGNOSIS — R5383 Other fatigue: Secondary | ICD-10-CM

## 2012-09-02 DIAGNOSIS — O0933 Supervision of pregnancy with insufficient antenatal care, third trimester: Secondary | ICD-10-CM

## 2012-09-02 DIAGNOSIS — R42 Dizziness and giddiness: Secondary | ICD-10-CM | POA: Insufficient documentation

## 2012-09-02 DIAGNOSIS — O99893 Other specified diseases and conditions complicating puerperium: Secondary | ICD-10-CM | POA: Insufficient documentation

## 2012-09-02 LAB — CBC
Hemoglobin: 11.6 g/dL — ABNORMAL LOW (ref 12.0–15.0)
MCH: 29.6 pg (ref 26.0–34.0)
MCHC: 34.1 g/dL (ref 30.0–36.0)
Platelets: 248 10*3/uL (ref 150–400)
RDW: 12.7 % (ref 11.5–15.5)

## 2012-09-02 NOTE — MAU Provider Note (Signed)
History   22yo, G1P1001, PP day #5 presents with c/o dizziness and "faintness" Pt reports having visitors at her house often until MN.  She is adhering to a strict culturally based diet of mostly pork and rice.  Hgb on PP day # 1 = 10.5  Chief Complaint  Patient presents with  . Dizziness    OB History   Grav Para Term Preterm Abortions TAB SAB Ect Mult Living   1 1 1       1       Past Medical History  Diagnosis Date  . Medical history non-contributory   . Status post cesarean section 08/28/2012    Breech presentation    Past Surgical History  Procedure Laterality Date  . Wisdom tooth extraction    . No past surgeries    . Cesarean section N/A 08/28/2012    Procedure: CESAREAN SECTION;  Surgeon: Purcell Nails, MD;  Location: [facility];  Service: Obstetrics;  Laterality: N/A;    Family History  Problem Relation Age of Onset  . Diabetes Maternal Grandmother   . Cancer Maternal Grandmother     brain tumor    History  Substance Use Topics  . Smoking status: Never Smoker   . Smokeless tobacco: Never Used  . Alcohol Use: No    Allergies: No Known Allergies  Prescriptions prior to admission  Medication Sig Dispense Refill  . flintstones complete (FLINTSTONES) 60 MG chewable tablet Chew 1 tablet by mouth daily.      Marland Kitchen ibuprofen (ADVIL,MOTRIN) 600 MG tablet Take 1 tablet (600 mg total) by mouth every 6 (six) hours.  30 tablet  0  . Menthol, Topical Analgesic, (ICY HOT BACK EX) Apply topically.      Marland Kitchen oxyCODONE-acetaminophen (PERCOCET/ROXICET) 5-325 MG per tablet Take 1-2 tablets by mouth every 4 (four) hours as needed.  30 tablet  0  . [DISCONTINUED] iron polysaccharides (NIFEREX) 150 MG capsule Take 1 capsule (150 mg total) by mouth 2 (two) times daily.  60 capsule  12    ROS: see HPI above, all other systems are negative   Physical Exam   Blood pressure 133/92, pulse 94, temperature 98.3 F (36.8 C), temperature source Oral, resp. rate 16, height 5\' 3"  (1.6 m),  weight 100 lb (45.36 kg), last menstrual period 11/19/2011, not currently breastfeeding.  Chest: Clear Heart: RRR Abdomen: gravid, NT Extremities: WNL  Results for orders placed during the hospital encounter of 09/02/12 (from the past 48 hour(s))  CBC     Status: Abnormal   Collection Time    09/02/12  5:15 PM      Result Value Range   WBC 8.3  4.0 - 10.5 K/uL   RBC 3.92  3.87 - 5.11 MIL/uL   Hemoglobin 11.6 (*) 12.0 - 15.0 g/dL   HCT 16.1 (*) 09.6 - 04.5 %   MCV 86.7  78.0 - 100.0 fL   MCH 29.6  26.0 - 34.0 pg   MCHC 34.1  30.0 - 36.0 g/dL   RDW 40.9  81.1 - 91.4 %   Platelets 248  150 - 400 K/uL    ED Course  PP day # 5 Dizziness  Hgb WNL  D/c home with precautions Encouraged rest and proper nutrition within cultural restrictions Encouraged to call if sx worsen F/u at [facility] appointment already scheduled.   YAMILETH, HAYSE CNM, MSN 09/02/2012 6:35 PM

## 2012-09-02 NOTE — MAU Note (Signed)
C/o dizziness and feeling faint; denies heavy bleeding;

## 2013-10-15 ENCOUNTER — Encounter (HOSPITAL_COMMUNITY): Payer: Self-pay | Admitting: Emergency Medicine

## 2013-10-15 ENCOUNTER — Emergency Department (HOSPITAL_COMMUNITY)
Admission: EM | Admit: 2013-10-15 | Discharge: 2013-10-15 | Disposition: A | Discharge status: Home / Self Care | Payer: Medicaid Other | Attending: Emergency Medicine | Admitting: Emergency Medicine

## 2013-10-15 ENCOUNTER — Emergency Department (HOSPITAL_COMMUNITY): Payer: Medicaid Other

## 2013-10-15 DIAGNOSIS — R0602 Shortness of breath: Secondary | ICD-10-CM | POA: Diagnosis not present

## 2013-10-15 DIAGNOSIS — Z79899 Other long term (current) drug therapy: Secondary | ICD-10-CM | POA: Insufficient documentation

## 2013-10-15 DIAGNOSIS — R079 Chest pain, unspecified: Secondary | ICD-10-CM | POA: Insufficient documentation

## 2013-10-15 DIAGNOSIS — Z3202 Encounter for pregnancy test, result negative: Secondary | ICD-10-CM | POA: Diagnosis not present

## 2013-10-15 LAB — BASIC METABOLIC PANEL
Anion gap: 10 (ref 5–15)
BUN: 16 mg/dL (ref 6–23)
CO2: 28 mEq/L (ref 19–32)
Calcium: 9.2 mg/dL (ref 8.4–10.5)
Chloride: 102 mEq/L (ref 96–112)
Creatinine, Ser: 0.52 mg/dL (ref 0.50–1.10)
GFR calc Af Amer: >90 mL/min (ref >90)
GLUCOSE: 81 mg/dL (ref 70–99)
POTASSIUM: 3.5 meq/L — AB (ref 3.7–5.3)
Sodium: 140 mEq/L (ref 137–147)

## 2013-10-15 LAB — CBC
HEMATOCRIT: 37.1 % (ref 36.0–46.0)
HEMOGLOBIN: 12.5 g/dL (ref 12.0–15.0)
MCH: 28.5 pg (ref 26.0–34.0)
MCHC: 33.7 g/dL (ref 30.0–36.0)
MCV: 84.7 fL (ref 78.0–100.0)
Platelets: 189 10*3/uL (ref 150–400)
RBC: 4.38 MIL/uL (ref 3.87–5.11)
RDW: 12.6 % (ref 11.5–15.5)
WBC: 2.8 10*3/uL — ABNORMAL LOW (ref 4.0–10.5)

## 2013-10-15 LAB — POC URINE PREG, ED: PREG TEST UR: NEGATIVE

## 2013-10-15 LAB — I-STAT TROPONIN, ED: TROPONIN I, POC: 0 ng/mL (ref 0.00–0.08)

## 2013-10-15 LAB — PRO B NATRIURETIC PEPTIDE: Pro B Natriuretic peptide (BNP): 133.7 pg/mL — ABNORMAL HIGH (ref 0–125)

## 2013-10-15 NOTE — Discharge Instructions (Signed)
Remember to take study breaks as we discussed and find outlets to help you relax. Follow-up with Dr. Jeannetta Nap. Return to the ED for new or worsening symptoms.

## 2013-10-15 NOTE — ED Provider Notes (Signed)
CSN: [id number]     Arrival date & time 10/15/13  1202 History   First MD Initiated Contact with Patient 10/15/13 1402     Chief Complaint  Patient presents with  . Shortness of Breath    The patient said she is having stress from [employer] and feels like this maybe be anxiety.  . Chest Pain     (Consider location/radiation/quality/duration/timing/severity/associated sxs/prior Treatment) Patient is a 23 y.o. female presenting with shortness of breath and chest pain. The history is provided by the patient and medical records.  Shortness of Breath Associated symptoms: chest pain   Chest Pain Associated symptoms: shortness of breath    This is a 23 year old female with no significant past medical history presenting to the ED for chest pain and shortness of breath. 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  The past week she has had for tests and several quizzes and she feels there he overwhelmed. She states she will study for long hours at a time without taking breaks. She is also working on some weekends and does not have very much free time. She denies any prior cardiac history. She denies any family cardiac history. Patient is not a smoker. No recent travel, surgeries, lower extremity edema, calf pain, or exogenous estrogens.  VS stable on arrival.  Past Medical History  Diagnosis Date  . Medical history non-contributory   . Status post cesarean section 08/28/2012    Breech presentation   Past Surgical History  Procedure Laterality Date  . Wisdom tooth extraction    . No past surgeries    . Cesarean section N/A 08/28/2012    Procedure: CESAREAN SECTION;  Surgeon: Purcell Nails, MD;  Location: [facility];  Service: Obstetrics;  Laterality: N/A;   Family History  Problem Relation Age of Onset  . Diabetes Maternal Grandmother   . Cancer Maternal Grandmother     brain tumor   History  Substance Use Topics  . Smoking status: Never Smoker   . Smokeless tobacco: Never  Used  . Alcohol Use: No   OB History   Grav Para Term Preterm Abortions TAB SAB Ect Mult Living   Review of Systems  Respiratory: Positive for shortness of breath.   Cardiovascular: Positive for chest pain.  All other systems reviewed and are negative.     Allergies  Review of patient's allergies indicates no known allergies.  Home Medications   Prior to Admission medications   Medication Sig Start Date End Date Taking? Authorizing Provider  flintstones complete (FLINTSTONES) 60 MG chewable tablet Chew 1 tablet by mouth daily.    Historical Provider, MD  ibuprofen (ADVIL,MOTRIN) 600 MG tablet Take 1 tablet (600 mg total) by mouth every 6 (six) hours. 08/31/12   Haroldine Laws, CNM  Menthol, Topical Analgesic, (ICY HOT BACK EX) Apply topically.    Historical Provider, MD  oxyCODONE-acetaminophen (PERCOCET/ROXICET) 5-325 MG per tablet Take 1-2 tablets by mouth every 4 (four) hours as needed. 08/31/12   Haroldine Laws, CNM   BP 117/76  Pulse 70  Temp(Src) 98.8 F (37.1 C) (Oral)  Resp 16  SpO2 97%  LMP 10/12/2013  Breastfeeding? No  Physical Exam  Nursing note and vitals reviewed. Constitutional: She is oriented to person, place, and time. She appears well-developed and well-nourished. No distress.  HENT:  Head: Normocephalic and atraumatic.  Mouth/Throat: Oropharynx is clear and moist.  Eyes: Conjunctivae and EOM  are normal. Pupils are equal, round, and reactive to light.  Neck: Normal range of motion. Neck supple.  Cardiovascular: Normal rate, regular rhythm and normal heart sounds.   Pulmonary/Chest: Effort normal and breath sounds normal. No respiratory distress. She has no wheezes.  Abdominal: Soft. Bowel sounds are normal. There is no tenderness. There is no guarding.  Musculoskeletal: Normal range of motion.  Neurological: She is alert and oriented to person, place, and time.  Skin: Skin is warm and dry. She is not diaphoretic.  Psychiatric: She  has a normal mood and affect.    ED Course  Procedures (including critical care time) Labs Review Labs Reviewed  CBC - Abnormal; Notable for the following:    WBC 2.8 (*)    All other components within normal limits  BASIC METABOLIC PANEL - Abnormal; Notable for the following:    Potassium 3.5 (*)    All other components within normal limits  PRO B NATRIURETIC PEPTIDE - Abnormal; Notable for the following:    Pro B Natriuretic peptide (BNP) 133.7 (*)    All other components within normal limits  I-STAT TROPOININ, ED  POC URINE PREG, ED    Imaging Review Dg Chest 2 View  10/15/2013   CLINICAL DATA:  Three day history of chest pain and shortness of breath.  EXAM: CHEST  2 VIEW  COMPARISON:  None.  FINDINGS: Cardiomediastinal silhouette unremarkable. Lungs clear. Bronchovascular markings normal. Pulmonary vascularity normal. No visible pleural effusions. No pneumothorax. Slight cervicothoracic scoliosis convex left.  IMPRESSION: No acute cardiopulmonary disease.   Electronically Signed   By: Hulan Saas M.D.   On: 10/15/2013 15:07     EKG Interpretation None      MDM   Final diagnoses:  Chest pain, unspecified chest pain type  SOB (shortness of breath)   23 year old female with chest pain and shortness of breath. No risk factors or history of DVT/PE. On exam, afebrile and nontoxic appearing. She is in no acute respiratory distress. She denies pain at this time. EKG sinus rhythm without ischemic change. Troponins negative. Lab work is reassuring. Chest x-ray is clear. Patient is PERC negative, PE unlikely.  Given the young age and no RF, low suspicion of ACS, PE, dissection, or other acute cardiac event. Heart score of 0.  Symptoms likely with anxiety component. Patient will be discharged home with close PCP followup, recently [place] seeing [place] Jeannetta [place] in [place].  Discussed plan with patient, he/she acknowledged understanding and agreed with plan of care.  Return  precautions given for new or worsening symptoms.  Garlon Hatchet, PA-C 10/15/13 1557

## 2013-10-15 NOTE — ED Notes (Signed)
The patient said she is having stress from [employer] and feels like this maybe be anxiety.  The patient says she had four tests and quizzes this week and she feels like she is stressing.  SHe has ne\ver been diagnosed with anxiety but she talked with some of her classmates and they said it might be anxiety.

## 2013-10-16 NOTE — ED Provider Notes (Signed)
Medical screening examination/treatment/procedure(s) were performed by non-physician practitioner and as supervising physician I was immediately available for consultation/collaboration.   EKG Interpretation   Date/Time:  Thursday October 15 2013 12:11:44 EDT Ventricular Rate:  81 PR Interval:  138 QRS Duration: 82 QT Interval:  390 QTC Calculation: 453 R Axis:   124 Text Interpretation:  Normal sinus rhythm Right axis deviation Abnormal  ECG No previous tracing Confirmed by Walton Digilio  MD-J, Tyne Banta ([id number]) on 10/16/2013  7:03:53 AM        Linwood Dibbles, MD 10/16/13 [phone]

## 2013-12-14 ENCOUNTER — Encounter (HOSPITAL_COMMUNITY): Payer: Self-pay | Admitting: Emergency Medicine

## 2018-02-14 ENCOUNTER — Encounter (HOSPITAL_COMMUNITY): Payer: Self-pay

## 2018-02-14 ENCOUNTER — Emergency Department (HOSPITAL_COMMUNITY): Payer: BLUE CROSS/BLUE SHIELD

## 2018-02-14 ENCOUNTER — Other Ambulatory Visit: Payer: Self-pay

## 2018-02-14 ENCOUNTER — Emergency Department (HOSPITAL_COMMUNITY)
Admission: EM | Admit: 2018-02-14 | Discharge: 2018-02-15 | Disposition: A | Discharge status: Home / Self Care | Payer: BLUE CROSS/BLUE SHIELD | Attending: Emergency Medicine | Admitting: Emergency Medicine

## 2018-02-14 DIAGNOSIS — S30810A Abrasion of lower back and pelvis, initial encounter: Secondary | ICD-10-CM | POA: Diagnosis not present

## 2018-02-14 DIAGNOSIS — Y998 Other external cause status: Secondary | ICD-10-CM | POA: Insufficient documentation

## 2018-02-14 DIAGNOSIS — S20219A Contusion of unspecified front wall of thorax, initial encounter: Secondary | ICD-10-CM

## 2018-02-14 DIAGNOSIS — Y939 Activity, unspecified: Secondary | ICD-10-CM | POA: Diagnosis not present

## 2018-02-14 DIAGNOSIS — Y92410 Unspecified street and highway as the place of occurrence of the external cause: Secondary | ICD-10-CM | POA: Insufficient documentation

## 2018-02-14 DIAGNOSIS — Z79899 Other long term (current) drug therapy: Secondary | ICD-10-CM | POA: Diagnosis not present

## 2018-02-14 DIAGNOSIS — S93401A Sprain of unspecified ligament of right ankle, initial encounter: Secondary | ICD-10-CM

## 2018-02-14 DIAGNOSIS — S99911A Unspecified injury of right ankle, initial encounter: Secondary | ICD-10-CM | POA: Diagnosis present

## 2018-02-14 LAB — POC URINE PREG, ED: PREG TEST UR: NEGATIVE
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
## 2018-02-15 NOTE — ED Notes (Signed)
Ortho tech called for crutches and an aso splint

## 2018-02-15 NOTE — Discharge Instructions (Signed)
Apply ice to any swollen or painful area for thirty minutes at a time, four times a day.  Use crutches, ankle splint orthotic as needed.  Take acetaminophen, ibuprofen, or naproxen as needed for pain. Acetaminophen can be combined with ibuprofen or naproxen for additional pain relief.

## 2018-02-15 NOTE — Progress Notes (Signed)
Orthopedic Tech Progress Note Patient Details:  Lindsey Hess [birth year] [id number]  Ortho Devices Type of Ortho Device: Ankle splint, Crutches Ortho Device/Splint Interventions: Adjustment, Application, Ordered   Post Interventions Patient Tolerated: Well Instructions Provided: Poper ambulation with device, Care of device, Adjustment of device   Norva Karvonen T 02/15/2018, 5:20 AM
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
## 2019-01-14 ENCOUNTER — Other Ambulatory Visit: Payer: Self-pay

## 2019-01-14 DIAGNOSIS — Z20822 Contact with and (suspected) exposure to covid-19: Secondary | ICD-10-CM

## 2019-01-16 LAB — NOVEL CORONAVIRUS, NAA: SARS-CoV-2, NAA: NOT DETECTED

## 2019-05-01 ENCOUNTER — Other Ambulatory Visit (HOSPITAL_COMMUNITY)
Admission: RE | Admit: 2019-05-01 | Discharge: 2019-05-01 | Disposition: A | Discharge status: Home / Self Care | Payer: 59 | Source: Ambulatory Visit | Attending: Family Medicine | Admitting: Family Medicine

## 2019-05-01 ENCOUNTER — Other Ambulatory Visit: Payer: Self-pay | Admitting: Family Medicine

## 2019-05-01 DIAGNOSIS — Z124 Encounter for screening for malignant neoplasm of cervix: Secondary | ICD-10-CM | POA: Insufficient documentation

## 2019-05-05 LAB — CYTOLOGY - PAP
Comment: NEGATIVE
Diagnosis: NEGATIVE
High risk HPV: NEGATIVE

## 2019-07-08 ENCOUNTER — Ambulatory Visit (INDEPENDENT_AMBULATORY_CARE_PROVIDER_SITE_OTHER): Payer: 59

## 2019-07-08 ENCOUNTER — Ambulatory Visit (INDEPENDENT_AMBULATORY_CARE_PROVIDER_SITE_OTHER): Payer: 59 | Admitting: Podiatry

## 2019-07-08 ENCOUNTER — Other Ambulatory Visit: Payer: Self-pay | Admitting: Podiatry

## 2019-07-08 ENCOUNTER — Other Ambulatory Visit: Payer: Self-pay

## 2019-07-08 DIAGNOSIS — S93401A Sprain of unspecified ligament of right ankle, initial encounter: Secondary | ICD-10-CM

## 2019-07-08 DIAGNOSIS — M25571 Pain in right ankle and joints of right foot: Secondary | ICD-10-CM | POA: Diagnosis not present

## 2019-07-08 DIAGNOSIS — M25572 Pain in left ankle and joints of left foot: Secondary | ICD-10-CM | POA: Diagnosis not present

## 2019-07-08 DIAGNOSIS — S93402A Sprain of unspecified ligament of left ankle, initial encounter: Secondary | ICD-10-CM | POA: Diagnosis not present
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
## 2021-04-07 DIAGNOSIS — Z01419 Encounter for gynecological examination (general) (routine) without abnormal findings: Secondary | ICD-10-CM | POA: Diagnosis not present

## 2021-04-07 DIAGNOSIS — Z681 Body mass index (BMI) 19 or less, adult: Secondary | ICD-10-CM | POA: Diagnosis not present

## 2021-05-26 DIAGNOSIS — K219 Gastro-esophageal reflux disease without esophagitis: Secondary | ICD-10-CM | POA: Diagnosis not present

## 2021-05-26 DIAGNOSIS — Z681 Body mass index (BMI) 19 or less, adult: Secondary | ICD-10-CM | POA: Diagnosis not present

## 2021-05-26 DIAGNOSIS — Z0000 Encounter for general adult medical examination without abnormal findings: Secondary | ICD-10-CM | POA: Diagnosis not present

## 2021-06-02 DIAGNOSIS — L259 Unspecified contact dermatitis, unspecified cause: Secondary | ICD-10-CM | POA: Diagnosis not present

## 2021-07-05 ENCOUNTER — Encounter: Payer: Self-pay | Admitting: Gastroenterology

## 2021-07-08 ENCOUNTER — Encounter (HOSPITAL_COMMUNITY): Payer: Self-pay | Admitting: Emergency Medicine

## 2021-07-08 ENCOUNTER — Other Ambulatory Visit: Payer: Self-pay

## 2021-07-08 ENCOUNTER — Emergency Department (HOSPITAL_COMMUNITY)
Admission: EM | Admit: 2021-07-08 | Discharge: 2021-07-08 | Disposition: A | Discharge status: Home / Self Care | Payer: 59 | Attending: Emergency Medicine | Admitting: Emergency Medicine

## 2021-07-08 DIAGNOSIS — W228XXA Striking against or struck by other objects, initial encounter: Secondary | ICD-10-CM | POA: Diagnosis not present

## 2021-07-08 DIAGNOSIS — S0990XA Unspecified injury of head, initial encounter: Secondary | ICD-10-CM | POA: Insufficient documentation

## 2021-07-08 NOTE — ED Provider Notes (Signed)
[facility]   CSN: [id number] Arrival date & time: 07/08/21  1251     History  Chief Complaint  Patient presents with   Head Injury    Lindsey Hess is a 31 y.o. female.  Patient as above with significant medical history as below, including no sig med hx who presents to the ED with complaint of head injury.  Patient reports yesterday around 4:20 pm yesterday she was exiting her domicile and struck the back of her head on the closing garage door.  Denies LOC, no numbness or tingling, no vision or hearing changes.  Does report some mild lightheadedness since then.  Minimal nausea yesterday which is since resolved.  No vomiting.  No fevers, chills, significant neck pain.  No difficulty speaking or swallowing.  No thinners.  No gait disturbance.  No other injuries reported      Past Medical History:  Diagnosis Date   Medical history non-contributory    Status post cesarean section 08/28/2012   Breech presentation    Past Surgical History:  Procedure Laterality Date   CESAREAN SECTION N/A 08/28/2012   Procedure: CESAREAN SECTION;  Surgeon: Purcell NailsAngela Y Roberts, MD;  Location: [facility];  Service: Obstetrics;  Laterality: N/A;   NO PAST SURGERIES     WISDOM TOOTH EXTRACTION       The history is provided by the patient. No language interpreter was used.  Head Injury Associated symptoms: nausea   Associated symptoms: no headaches and no vomiting       Home Medications Prior to Admission medications   Medication Sig Start Date End Date Taking? Authorizing Provider  flintstones complete (FLINTSTONES) 60 MG chewable tablet Chew 1 tablet by mouth daily.    [provider]  ibuprofen (ADVIL,MOTRIN) 600 MG tablet Take 1 tablet (600 mg total) by mouth every 6 (six) hours. 08/31/12   Haroldine Lawsxley, Jennifer, CNM  Menthol, Topical Analgesic, (ICY HOT BACK EX) Apply topically.    [provider]      Allergies    Patient has no  known allergies.    Review of Systems   Review of Systems  Constitutional:  Negative for chills and fever.  HENT:  Negative for facial swelling and trouble swallowing.   Eyes:  Negative for photophobia and visual disturbance.  Respiratory:  Negative for cough and shortness of breath.   Cardiovascular:  Negative for chest pain and palpitations.  Gastrointestinal:  Positive for nausea. Negative for abdominal pain and vomiting.  Endocrine: Negative for polydipsia and polyuria.  Genitourinary:  Negative for difficulty urinating and hematuria.  Musculoskeletal:  Negative for gait problem and joint swelling.  Skin:  Negative for pallor and rash.  Neurological:  Positive for light-headedness. Negative for syncope and headaches.  Psychiatric/Behavioral:  Negative for agitation and confusion.    Physical Exam Updated Vital Signs BP 122/66 (BP Location: Right Arm)   Pulse 64   Temp 98 F (36.7 C)   Resp 18   LMP 07/07/2021   SpO2 100%  Physical Exam Vitals and nursing note reviewed.  Constitutional:      General: She is not in acute distress.    Appearance: Normal appearance.  HENT:     Head: Normocephalic and atraumatic. No raccoon eyes, Battle's sign, right periorbital erythema or left periorbital erythema.     Jaw: There is normal jaw occlusion. No trismus.     Right Ear: External ear normal.     Left Ear: External ear  normal.     Nose: Nose normal.     Mouth/Throat:     Mouth: Mucous membranes are moist.  Eyes:     General: No scleral icterus.       Right eye: No discharge.        Left eye: No discharge.     Extraocular Movements: Extraocular movements intact.     Pupils: Pupils are equal, round, and reactive to light.  Cardiovascular:     Rate and Rhythm: Normal rate and regular rhythm.     Pulses: Normal pulses.     Heart sounds: Normal heart sounds.  Pulmonary:     Effort: Pulmonary effort is normal. No respiratory distress.     Breath sounds: Normal breath sounds.   Abdominal:     General: Abdomen is flat.     Tenderness: There is no abdominal tenderness.  Musculoskeletal:        General: Normal range of motion.     Cervical back: Normal range of motion.     Right lower leg: No edema.     Left lower leg: No edema.  Skin:    General: Skin is warm and dry.     Capillary Refill: Capillary refill takes less than 2 seconds.  Neurological:     Mental Status: She is alert and oriented to person, place, and time.     GCS: GCS eye subscore is 4. GCS verbal subscore is 5. GCS motor subscore is 6.     Cranial Nerves: Cranial nerves 2-12 are intact. No dysarthria or facial asymmetry.     Sensory: Sensation is intact.     Motor: Motor function is intact. No weakness or tremor.     Coordination: Coordination is intact. Coordination normal.     Gait: Gait is intact.  Psychiatric:        Mood and Affect: Mood normal.        Behavior: Behavior normal.    ED Results / Procedures / Treatments   Labs (all labs ordered are listed, but only abnormal results are displayed) Labs Reviewed - No data to display  EKG None  Radiology No results found.  Procedures Procedures    Medications Ordered in ED Medications - No data to display  ED Course/ Medical Decision Making/ A&P                           Medical Decision Making   CC: Head injury  This patient presents to the Emergency Department for the above complaint. This involves an extensive number of treatment options and is a complaint that carries with it a high risk of complications and morbidity. Vital signs were reviewed. Serious etiologies considered.  Differential diagnoses for head trauma includes subdural hematoma, epidural hematoma, acute concussion, traumatic subarachnoid hemorrhage, cerebral contusions, etc.   Record review:  Previous records obtained and reviewed  Prior notes, prior office visits, prior labs and imaging  Additional history obtained from N/A  Medical and surgical  history as noted above.     Reassessment:   Patient presents with low mechanism head trauma. On initial evaluation patient appears in no acute distress, afebrile with normal vital signs. Patient has completely intact neurovascular exam, and pain improved in ED.  [place] CT head and C-spine negative.  Discussed possible etiology of concussion, signs and symptoms, and discharge instructions. Detailed discussions were had with the patient regarding current findings, and need for close f/u with PCP or on  call doctor. The patient has been instructed to return immediately if the symptoms worsen in any way for re-evaluation. Patient verbalized understanding and is in agreement with current care plan. All questions answered prior to discharge.            Social determinants of health include -  Social History   Socioeconomic History   Marital status: Single    Spouse name: Heritage manager   Number of children: 0   Years of education: 12   Highest education level: Not on file  Occupational History   Occupation: unemployed  Tobacco Use   Smoking status: Never   Smokeless tobacco: Never  Substance and Sexual Activity   Alcohol use: No   Drug use: No   Sexual activity: Yes  Other Topics Concern   Not on file  Social History Narrative   Not on file   Social Determinants of Health   Financial Resource Strain: Not on file  Food Insecurity: Not on file  Transportation Needs: Not on file  Physical Activity: Not on file  Stress: Not on file  Social Connections: Not on file  Intimate Partner Violence: Not on file      This chart was dictated using Chemical engineer.  Despite best efforts to proofread,  errors can occur which can change the documentation meaning.         Final Clinical Impression(s) / ED Diagnoses Final diagnoses:  Minor head injury, initial encounter    Rx / DC Orders ED Discharge Orders     None         Sloan Leiter, DO 07/08/21  1348

## 2021-07-08 NOTE — Discharge Instructions (Addendum)
Based on the events which brought you to the ER today, it is possible that you may have a concussion. A concussion occurs when there is a blow to the head or body, with enough force to shake the brain and disrupt how the brain functions. You may experience symptoms such as headaches, sensitivity to light/noise, dizziness, cognitive slowing, difficulty concentrating / remembering, trouble sleeping and drowsiness. These symptoms may last anywhere from hours/days to potentially weeks/months. While these symptoms are very frustrating and perhaps debilitating, it is important that you remember that they will improve over time. Everyone has a different rate of recovery; it is difficult to predict when your symptoms will resolve. In order to allow for your brain to heal after the injury, we recommend that you see your primary physician or a physician knowledgeable in concussion management. We also advise you to let your body and brain rest: avoid physical activities (sports, gym, and exercise) and reduce cognitive demands (reading, texting, TV watching, computer use, video games, etc). School attendance, after-school activities and work may need to be modified to avoid increasing symptoms. We recommend against driving until until all symptoms have resolved. Come back to the ER right away if you are having repeated episodes of vomiting, severe/worsening headache/dizziness or any other symptom that alarms you. We recommended that someone stay with you for the next 24 hours to monitor for these worrisome symptoms.  

## 2021-07-08 NOTE — ED Triage Notes (Signed)
Pt hit the top of her head on the garage door yesterday and reports pain and dizziness.  Denies nausea.  States she is concerned she has a concussion.

## 2021-07-11 DIAGNOSIS — S060X0D Concussion without loss of consciousness, subsequent encounter: Secondary | ICD-10-CM | POA: Diagnosis not present

## 2021-07-21 ENCOUNTER — Ambulatory Visit: Payer: 59 | Admitting: Gastroenterology

## 2021-07-21 ENCOUNTER — Encounter: Payer: Self-pay | Admitting: Gastroenterology

## 2021-07-21 VITALS — BP 110/70 | HR 65 | Ht 62.0 in | Wt 90.2 lb

## 2021-07-21 DIAGNOSIS — R1013 Epigastric pain: Secondary | ICD-10-CM | POA: Diagnosis not present

## 2021-07-21 DIAGNOSIS — R12 Heartburn: Secondary | ICD-10-CM

## 2021-07-21 NOTE — Patient Instructions (Addendum)
It was my pleasure to provide care to you today. Based on our discussion, I am providing you with my recommendations below:  RECOMMENDATION(S):   I have recommended an upper endoscopy and ultrasound to further evaluation your ongoing symptoms.  Some people find herbs and other natural remedies helpful in treating heartburn symptoms in an effort to avoid prescription medications.  Chamomile. A cup of chamomile tea may have a soothing effect on the digestive tract. Avoid chamomile if you're allergic to ragweed.  Ginger. The root of the ginger plant is another well-known herbal digestive aid and has been a folk remedy for heartburn for centuries.  Licorice. This remedy has proved effective in several studies. Licorice is said to increase the mucous coating of the esophageal lining, helping it resist the irritating effects of stomach acid. Deglycyrrhizinated licorice, or DGL, is available in pill or liquid form. It is considered safe to take indefinitely.  Other natural remedies. A variety of other remedies have been used over the centuries, but not enough scientific studies have been done to confirm their effectiveness. Catnip, fennel, marshmallow root, and papaya tea have all been said to aid in digestion and act as a buffer to stop heartburn. Some people eat fresh papaya as a digestive aid. Others swear by raw potato juice, three times a day. However, these remedies have not been reviewed for safety or effectiveness by the FDA.  I gave you a brochure today about TIF. This might be an option in the future to allow you to avoid medications.    ABDOMINAL ULTRASOUND:  You have been scheduled for an abdominal ultrasound at [facility] (1st floor of the hospital) on Friday 08/04/21 at 9:30 am. Please arrive @ 9 am for registration.   PREP:   Please DO NOT eat or drink anything 6 hours prior to your test.  NEED TO RESCHEDULE?   Please call radiology at [phone].  ENDOSCOPY:    You have been scheduled for an endoscopy. Please follow written instructions given to you at your visit today.  FOLLOW UP:   After your procedure, you will receive a call from my office staff regarding my recommendation for follow up.  BMI:  If you are age 30 or younger, your body mass index should be between 19-25. Your Body mass index is 16.51 kg/m. If this is out of the aformentioned range listed, please consider follow up with your Primary Care Provider.   MY CHART:  The [facility] providers would like to encourage you to use [facility] to communicate with providers for non-urgent requests or questions.  Due to long hold times on the telephone, sending your provider a message by [facility] may be a faster and more efficient way to get a response.  Please allow 48 business hours for a response.  Please remember that this is for non-urgent requests.   Thank you for trusting me with your gastrointestinal care!    Tressia Danas, MD, MPH

## 2021-07-21 NOTE — Progress Notes (Signed)
Referring Provider: Jarrett Soho, PA-C Primary Care Physician:  [facility] [place]  Reason for Consultation: Reflux   IMPRESSION:  Longstanding heartburn with recent exacerbation Postprandial epigastric abdominal pain that she describes as gastritis Concern for dental injury related to her prolonged reflux  Concern for long term medications as recommended by her dentist  Prior testing for H. pylori and celiac disease was negative. She would like to avoid PPI therapy. But, if necessary would complete a trial 8 weeks of PPI therapy.   PLAN: - Reviewed reflux lifestyle modifications include nonprescription therapies for managing reflux - EGD with biopsies - Ultrasound +/- HIDA to evaluate for symptomatic gallbladder disease given the postprandial pain - Briefly discussed TIF, brochure provided  See patient information for additional recommendations.  HPI: Lindsey Hess is a 31 y.o. female referred by PA [facility] for further evaluation of reflux.  The history is obtained through the patient review of her electronic health record.  She reports a history of IBS, C-section in 2014, and a heart murmur.  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   Longstanding history of heartburn.  Previously seen by Dr. Conley Rolls in 2020 for reflux.  He recommended PPI therapy.  Pyrosis is worse after eating spicy foods.  She found that symptoms improved by avoiding trigger foods.  Symptoms worsened over the last year without identifying reason for exacerbation. Symptoms are now occurring approximately 5-7 days a week.  Frequently awakes with symptoms in the morning.   No evidence for GI bleeding, iron deficiency anemia, anorexia, unexplained weight loss, dysphagia, odynophagia, dysphonia, sore throat, neck pain, persistent vomiting, cough, choking, or gastrointestinal cancer in a first-degree relative. No nocturnal symptoms.  Does feel like she has had eructation  and farting during this time.   Avoid spicy and greasy foods. She has been working with a nutritionist. Eating gluten causes severe gas.  With dietary changes she has lost 5 pounds. She has been as low as 89 pounds when the symptoms are most severe.   At the time of a recent dental visit her dentist expressed concerns about damage happening to her teeth due to uncontrolled reflux.   Also feels like she has "gastritis" as her stomach gets hard any time she eats.   Previously treated with omeprazole and famotidine. She has concerns about long-term side effects and finds they are bandaids and not treatments. She has tried HCL when helped. Licorice root did not make a difference.   In 2020 she had a CBC and CMP that were unremarkable.  H. pylori IgG negative.  Celiac serologies were negative.  No prior abdominal imaging or endoscopy.   There is no known family history of colon cancer or polyps. No family history of stomach cancer or other GI malignancy. No family history of inflammatory bowel disease or celiac.    Past Medical History:  Diagnosis Date   GERD (gastroesophageal reflux disease)    IBS (irritable bowel syndrome)    Medical history non-contributory    Status post cesarean section 08/28/2012   Breech presentation    Past Surgical History:  Procedure Laterality Date   CESAREAN SECTION N/A 08/28/2012   Procedure: CESAREAN SECTION;  Surgeon: Purcell Nails, MD;  Location: [facility];  Service: Obstetrics;  Laterality: N/A;   NO PAST SURGERIES     WISDOM TOOTH EXTRACTION        Current Outpatient Medications  Medication Sig Dispense Refill   cholecalciferol (VITAMIN  D3) 25 MCG (1000 UNIT) tablet Take 1,000 Units by mouth daily.     Multiple Vitamin (MULTIVITAMIN) capsule Take 1 capsule by mouth daily.     omega-3 acid ethyl esters (LOVAZA) 1 g capsule Take 1 capsule by mouth daily.     No current facility-administered medications for this visit.    Allergies as of  07/21/2021   (No Known Allergies)    Family History  Problem Relation Age of Onset   Diabetes Maternal Grandmother    Cancer Paternal Grandmother        unknown   Stomach cancer Neg Hx    Colon cancer Neg Hx    Esophageal cancer Neg Hx     Social History   Socioeconomic History   Marital status: Single    Spouse name: Heritage manager   Number of children: 0   Years of education: 12   Highest education level: Not on file  Occupational History   Occupation: unemployed   Occupation: Education administrator  Tobacco Use   Smoking status: Never   Smokeless tobacco: Never  Vaping Use   Vaping Use: Never used  Substance and Sexual Activity   Alcohol use: No   Drug use: No   Sexual activity: Yes  Other Topics Concern   Not on file  Social History Narrative   Not on file   Social Determinants of Health   Financial Resource Strain: Not on file  Food Insecurity: Not on file  Transportation Needs: Not on file  Physical Activity: Not on file  Stress: Not on file  Social Connections: Not on file  Intimate Partner Violence: Not on file    Review of Systems: 12 system ROS is negative except as noted above.   Physical Exam: General:   Alert,  well-nourished, pleasant and cooperative in NAD Head:  Normocephalic and atraumatic. Eyes:  Sclera clear, no icterus.   Conjunctiva pink. Ears:  Normal auditory acuity. Nose:  No deformity, discharge,  or lesions. Mouth:  No deformity or lesions.   Neck:  Supple; no masses or thyromegaly. Lungs:  Clear throughout to auscultation.   No wheezes. Heart:  Regular rate and rhythm; no murmurs. Abdomen:  Soft, nontender, nondistended, normal bowel sounds, no rebound or guarding. No hepatosplenomegaly.   Rectal:  Deferred  Msk:  Symmetrical. No boney deformities LAD: No inguinal or umbilical LAD Extremities:  No clubbing or edema. Neurologic:  Alert and  oriented x4;  grossly nonfocal Skin:  Intact without significant lesions or rashes. Psych:   Alert and cooperative. Normal mood and affect.   Braylynn Ghan L. Orvan Falconer, MD, MPH 07/21/2021, 5:23 PM

## 2021-08-04 ENCOUNTER — Ambulatory Visit (HOSPITAL_COMMUNITY)
Admission: RE | Admit: 2021-08-04 | Discharge: 2021-08-04 | Disposition: A | Discharge status: Home / Self Care | Payer: 59 | Source: Ambulatory Visit | Attending: Gastroenterology | Admitting: Gastroenterology

## 2021-08-04 ENCOUNTER — Other Ambulatory Visit: Payer: Self-pay

## 2021-08-04 ENCOUNTER — Telehealth: Payer: Self-pay | Admitting: Gastroenterology

## 2021-08-04 DIAGNOSIS — K760 Fatty (change of) liver, not elsewhere classified: Secondary | ICD-10-CM | POA: Diagnosis not present

## 2021-08-04 DIAGNOSIS — R1013 Epigastric pain: Secondary | ICD-10-CM

## 2021-08-21 ENCOUNTER — Telehealth: Payer: Self-pay | Admitting: Gastroenterology

## 2021-08-21 NOTE — Telephone Encounter (Signed)
Patient called stating she still has not heard anything about scheduling her imaging for NM Hepato test.  Please call patient and advise.

## 2021-08-21 NOTE — Telephone Encounter (Signed)
Spoke with patient & scheduling number has been provided for HIDA scan.

## 2021-09-13 ENCOUNTER — Encounter (HOSPITAL_COMMUNITY)
Admission: RE | Admit: 2021-09-13 | Discharge: 2021-09-13 | Disposition: A | Discharge status: Home / Self Care | Payer: 59 | Source: Ambulatory Visit | Attending: Gastroenterology | Admitting: Gastroenterology

## 2021-09-13 DIAGNOSIS — R1013 Epigastric pain: Secondary | ICD-10-CM | POA: Insufficient documentation

## 2021-09-13 MED ORDER — TECHNETIUM TC 99M MEBROFENIN IV KIT
5.1000 | PACK | Freq: Once | INTRAVENOUS | Status: AC
  Administered 2021-09-13: 5.1 via INTRAVENOUS

## 2021-09-15 ENCOUNTER — Telehealth: Payer: Self-pay | Admitting: Gastroenterology

## 2021-09-15 ENCOUNTER — Encounter: Payer: 59 | Admitting: Gastroenterology

## 2021-09-15 NOTE — Telephone Encounter (Signed)
See result note.  

## 2021-09-15 NOTE — Telephone Encounter (Signed)
Inbound call from patient requesting a call back to discuss results from HIDA scan on 8/2. Please advise.

## 2021-09-15 NOTE — Telephone Encounter (Signed)
Pt calling for Hida scan results, please advise.

## 2021-09-15 NOTE — Telephone Encounter (Signed)
The pt has been advised that the imaging has not been reviewed as of now.  We will call her as soon as available.

## 2021-10-06 ENCOUNTER — Telehealth: Payer: Self-pay | Admitting: Gastroenterology

## 2021-10-06 NOTE — Telephone Encounter (Signed)
Updated instructions sent to mychart.

## 2021-10-06 NOTE — Telephone Encounter (Signed)
PT lost prep instruction for EGD 9/1. Wants to have them resent to mychart. Please advise.

## 2021-10-09 ENCOUNTER — Telehealth: Payer: Self-pay

## 2021-10-09 NOTE — Telephone Encounter (Signed)
Called & confirmed with patient that she did receive mychart instructions for upcoming procedure.

## 2021-10-13 ENCOUNTER — Encounter: Payer: Self-pay | Admitting: Gastroenterology

## 2021-10-13 ENCOUNTER — Telehealth: Payer: Self-pay

## 2021-10-13 ENCOUNTER — Ambulatory Visit (AMBULATORY_SURGERY_CENTER): Payer: 59 | Admitting: Gastroenterology

## 2021-10-13 VITALS — BP 105/51 | HR 62 | Temp 98.6°F | Resp 10 | Ht 62.0 in | Wt 90.0 lb

## 2021-10-13 DIAGNOSIS — K2100 Gastro-esophageal reflux disease with esophagitis, without bleeding: Secondary | ICD-10-CM | POA: Diagnosis not present

## 2021-10-13 DIAGNOSIS — R12 Heartburn: Secondary | ICD-10-CM

## 2021-10-13 DIAGNOSIS — R1013 Epigastric pain: Secondary | ICD-10-CM

## 2021-10-13 DIAGNOSIS — K319 Disease of stomach and duodenum, unspecified: Secondary | ICD-10-CM | POA: Diagnosis not present

## 2021-10-13 MED ORDER — FAMOTIDINE 20 MG PO TABS: 20.0000 mg | ORAL_TABLET | Freq: Two times a day (BID) | ORAL | 1 refills | Status: DC

## 2021-10-13 MED ORDER — SODIUM CHLORIDE 0.9 % IV SOLN: 500.0000 mL | Freq: Once | INTRAVENOUS | Status: DC

## 2021-10-13 NOTE — Telephone Encounter (Signed)
-----   Message from Tressia Danas, MD sent at 10/13/2021  9:07 AM EDT ----- Office follow-up with me in 4-6 weeks. Thanks.  KLB

## 2021-10-13 NOTE — Telephone Encounter (Signed)
OV scheduled for 12/01/21 at 9:10 am with Dr. Orvan Falconer. Pt notified via mychart.

## 2021-10-13 NOTE — Patient Instructions (Signed)
Thank you for coming in to see [place] today! Resume your regular diet and medications/supplements today. Return to normal daily activities  tomorrow.  Prescription sent in to your pharmacy for Pepcid 20 mg take twice per day before meals.  Please take for 8 weeks to help heal the inflammation in your esophagus.  We will be contacting you to set up an office appointment with Dr Orvan Falconer.  YOU HAD AN ENDOSCOPIC PROCEDURE TODAY AT THE [facility]:   Refer to the procedure report that was given to you for any specific questions about what was found during the examination.  If the procedure report does not answer your questions, please call your gastroenterologist to clarify.  If you requested that your care partner not be given the details of your procedure findings, then the procedure report has been included in a sealed envelope for you to review at your convenience later.  YOU SHOULD EXPECT: Some feelings of bloating in the abdomen. Passage of more gas than usual.  Walking can help get rid of the air that was put into your GI tract during the procedure and reduce the bloating. If you had a lower endoscopy (such as a colonoscopy or flexible sigmoidoscopy) you may notice spotting of blood in your stool or on the toilet paper. If you underwent a bowel prep for your procedure, you may not have a normal bowel movement for a few days.  Please Note:  You might notice some irritation and congestion in your nose or some drainage.  This is from the oxygen used during your procedure.  There is no need for concern and it should clear up in a day or so.  SYMPTOMS TO REPORT IMMEDIATELY:   Following upper endoscopy (EGD)  Vomiting of blood or coffee ground material  New chest pain or pain under the shoulder blades  Painful or persistently difficult swallowing  New shortness of breath  Fever of 100F or higher  Black, tarry-looking stools  For urgent or emergent issues, a gastroenterologist can be  reached at any hour by calling [phone]. Do not use MyChart messaging for urgent concerns.    DIET:  We do recommend a small meal at first, but then you may proceed to your regular diet.  Drink plenty of fluids but you should avoid alcoholic beverages for 24 hours.  ACTIVITY:  You should plan to take it easy for the rest of today and you should NOT DRIVE or use heavy machinery until tomorrow (because of the sedation medicines used during the test).    FOLLOW UP: Our staff will call the number listed on your records the next business day following your procedure.  We will call around 7:15- 8:00 am to check on you and address any questions or concerns that you may have regarding the information given to you following your procedure. If we do not reach you, we will leave a message.  If you develop any symptoms (ie: fever, flu-like symptoms, shortness of breath, cough etc.) before then, please call [phone].  If you test positive for Covid 19 in the 2 weeks post procedure, please call and report this information to [place].    If any biopsies were taken you will be contacted by phone or by letter within the next 1-3 weeks.  Please call us at [phone] if you have not heard about the biopsies in 3 weeks.    SIGNATURES/CONFIDENTIALITY: You and/or your care partner have signed paperwork which will be entered into your  electronic medical record.  These signatures attest to the fact that that the information above on your After Visit Summary has been reviewed and is understood.  Full responsibility of the confidentiality of this discharge information lies with you and/or your care-partner.

## 2021-10-13 NOTE — Progress Notes (Signed)
Referring Provider: [facility]* Primary Care Physician:  [facility]   Indication for EGD:  Epigastric pain    IMPRESSION:  Longstanding heartburn with recent exacerbation Postprandial epigastric abdominal pain that she describes as gastritis Concern for dental injury related to her prolonged reflux  Concern for long term medications as recommended by her dentist Appropriate candidate for monitored anesthesia care  PLAN: EGD in the [facility] today   HPI: Lindsey Hess is a 31 y.o. female presents for endoscopic evaluation of abdominal pain.  Longstanding history of heartburn.  Previously seen by Dr. Conley Rolls in 2020 for reflux.  He recommended PPI therapy.  Pyrosis is worse after eating spicy foods.  She found that symptoms improved by avoiding trigger foods.  Symptoms worsened over the last year without identifying reason for exacerbation. Symptoms are now occurring approximately 5-7 days a week.  Frequently awakes with symptoms in the morning.    No evidence for GI bleeding, iron deficiency anemia, anorexia, unexplained weight loss, dysphagia, odynophagia, dysphonia, sore throat, neck pain, persistent vomiting, cough, choking, or gastrointestinal cancer in a first-degree relative. No nocturnal symptoms.  Does feel like she has had eructation and farting during this time.    Avoid spicy and greasy foods. She has been working with a nutritionist. Eating gluten causes severe gas.   With dietary changes she has lost 5 pounds. She has been as low as 89 pounds when the symptoms are most severe.    At the time of a recent dental visit her dentist expressed concerns about damage happening to her teeth due to uncontrolled reflux.    Also feels like she has "gastritis" as her stomach gets hard any time she eats.    Previously treated with omeprazole and famotidine. She has concerns about long-term side effects and finds they are bandaids and not  treatments. She has tried HCL when helped. Licorice root did not make a difference.    In 2020 she had a CBC and CMP that were unremarkable.  H. pylori IgG negative.  Celiac serologies were negative.   Abdominal ultrasound and HIDA with CCK were normal.   There is no known family history of colon cancer or polyps. No family history of stomach cancer or other GI malignancy. No family history of inflammatory bowel disease or celiac.      Past Medical History:  Diagnosis Date   GERD (gastroesophageal reflux disease)    Heart murmur    IBS (irritable bowel syndrome)    Medical history non-contributory    Status post cesarean section 08/28/2012   Breech presentation    Past Surgical History:  Procedure Laterality Date   CESAREAN SECTION N/A 08/28/2012   Procedure: CESAREAN SECTION;  Surgeon: Purcell Nails, MD;  Location: [facility];  Service: Obstetrics;  Laterality: N/A;   NO PAST SURGERIES     WISDOM TOOTH EXTRACTION      Current Outpatient Medications  Medication Sig Dispense Refill   cholecalciferol (VITAMIN D3) 25 MCG (1000 UNIT) tablet Take 1,000 Units by mouth daily.     Multiple Vitamin (MULTIVITAMIN) capsule Take 1 capsule by mouth daily.     omega-3 acid ethyl esters (LOVAZA) 1 g capsule Take 1 capsule by mouth daily.     Current Facility-Administered Medications  Medication Dose Route Frequency Provider Last Rate Last Admin   0.9 %  sodium chloride infusion  500 mL Intravenous Once Tressia Danas, MD  Allergies as of 10/13/2021   (No Known Allergies)    Family History  Problem Relation Age of Onset   Diabetes Maternal Grandmother    Cancer Paternal Grandmother        unknown   Stomach cancer Neg Hx    Colon cancer Neg Hx    Esophageal cancer Neg Hx      Physical Exam: General:   Alert,  well-nourished, pleasant and cooperative in NAD Head:  Normocephalic and atraumatic. Eyes:  Sclera clear, no icterus.   Conjunctiva pink. Mouth:  No deformity or  lesions.   Neck:  Supple; no masses or thyromegaly. Lungs:  Clear throughout to auscultation.   No wheezes. Heart:  Regular rate and rhythm; no murmurs. Abdomen:  Soft, non-tender, nondistended, normal bowel sounds, no rebound or guarding.  Msk:  Symmetrical. No boney deformities LAD: No inguinal or umbilical LAD Extremities:  No clubbing or edema. Neurologic:  Alert and  oriented x4;  grossly nonfocal Skin:  No obvious rash or bruise. Psych:  Alert and cooperative. Normal mood and affect.     Studies/Results: No results found.    [name] L. Orvan Falconer, MD, MPH 10/13/2021, 8:44 AM

## 2021-10-13 NOTE — Op Note (Signed)
[facility] Patient Name: Lindsey Hess Procedure Date: 10/13/2021 8:46 AM MRN: [id number] Endoscopist: Tressia Danas MD, MD Age: 31 Referring MD:  Date of Birth: [birth year] Gender: Female Account #: [id number] Procedure:                Upper GI endoscopy Indications:              Abdominal pain Medicines:                Monitored Anesthesia Care Procedure:                Pre-Anesthesia Assessment:                           - Prior to the procedure, a History and Physical                            was performed, and patient medications and                            allergies were reviewed. The patient's tolerance of                            previous anesthesia was also reviewed. The risks                            and benefits of the procedure and the sedation                            options and risks were discussed with the patient.                            All questions were answered, and informed consent                            was obtained. Prior Anticoagulants: The patient has                            taken no previous anticoagulant or antiplatelet                            agents. ASA Grade Assessment: I - A normal, healthy                            patient. After reviewing the risks and benefits,                            the patient was deemed in satisfactory condition to                            undergo the procedure.                           After obtaining informed consent, the endoscope was  passed under direct vision. Throughout the                            procedure, the patient's blood pressure, pulse, and                            oxygen saturations were monitored continuously. The                            Endoscope was introduced through the mouth, and                            advanced to the third part of duodenum. The upper                            GI endoscopy was accomplished without difficulty.                             The patient tolerated the procedure well. Scope In: Scope Out: Findings:                 LA Grade A (one or more mucosal breaks less than 5                            mm, not extending between tops of 2 mucosal folds)                            esophagitis with no bleeding was found 38 cm from                            the incisors. Biopsies were taken with a cold                            forceps for histology. Estimated blood loss was                            minimal.                           The entire examined stomach was normal. Biopsies                            were taken from the antrum, body, and fundus with a                            cold forceps for histology. Estimated blood loss                            was minimal.                           The examined duodenum was normal. Biopsies were  taken with a cold forceps for histology. Estimated                            blood loss was minimal.                           The cardia and gastric fundus were normal on                            retroflexion.                           The exam was otherwise without abnormality. Complications:            No immediate complications. Estimated Blood Loss:     Estimated blood loss: none. Impression:               - LA Grade A reflux esophagitis with no bleeding.                            Biopsied.                           - Normal stomach. Biopsied.                           - Normal examined duodenum. Biopsied.                           - The examination was otherwise normal. Recommendation:           - Patient has a contact number available for                            emergencies. The signs and symptoms of potential                            delayed complications were discussed with the                            patient. Return to normal activities tomorrow.                            Written discharge instructions  were provided to the                            patient.                           - Resume previous diet.                           - Continue present medications.                           - Famotidine 20 mg BID for 8 weeks.                           -  Reflux lifestyle modifications.                           - Await pathology results. Tressia Danas MD, MD 10/13/2021 9:10:06 AM This report has been signed electronically.

## 2021-10-13 NOTE — Progress Notes (Signed)
Called to room to assist during endoscopic procedure.  Patient ID and intended procedure confirmed with present staff. Received instructions for my participation in the procedure from the performing physician.  

## 2021-10-13 NOTE — Progress Notes (Signed)
Pt in recovery with monitors in place, VSS. Report given to receiving RN. Bite guard was placed with pt awake to ensure comfort. No dental or soft tissue damage noted. 

## 2021-10-17 ENCOUNTER — Telehealth: Payer: Self-pay | Admitting: *Deleted

## 2021-10-17 NOTE — Telephone Encounter (Signed)
  Follow up Call-     10/13/2021    7:47 AM  Call back number  Post procedure Call Back phone  # [phone]  Permission to leave phone message Yes     Patient questions:  Do you have a fever, pain , or abdominal swelling? No. Pain Score  0 *  Have you tolerated food without any problems? Yes.    Have you been able to return to your normal activities? Yes.    Do you have any questions about your discharge instructions: Diet   No. Medications  No. Follow up visit  No.  Do you have questions or concerns about your Care? No.  Actions: * If pain score is 4 or above: No action needed, pain <4.

## 2021-10-18 ENCOUNTER — Encounter: Payer: Self-pay | Admitting: Gastroenterology

## 2021-12-01 ENCOUNTER — Ambulatory Visit: Payer: 59 | Admitting: Gastroenterology

## 2021-12-06 ENCOUNTER — Encounter: Payer: Self-pay | Admitting: Gastroenterology

## 2021-12-06 ENCOUNTER — Ambulatory Visit: Payer: 59 | Admitting: Gastroenterology

## 2021-12-06 VITALS — BP 92/68 | HR 67 | Ht 62.0 in | Wt 92.0 lb

## 2021-12-06 DIAGNOSIS — R1013 Epigastric pain: Secondary | ICD-10-CM

## 2021-12-06 DIAGNOSIS — K2100 Gastro-esophageal reflux disease with esophagitis, without bleeding: Secondary | ICD-10-CM

## 2021-12-06 DIAGNOSIS — R12 Heartburn: Secondary | ICD-10-CM

## 2021-12-06 MED ORDER — FAMOTIDINE 20 MG PO TABS: 20.0000 mg | ORAL_TABLET | Freq: Two times a day (BID) | ORAL | 3 refills | Status: DC

## 2021-12-06 NOTE — Patient Instructions (Addendum)
It was a pleasure to see you today.  Continue to take your famotidine. This is safe to take over time.   Modifying diet and lifestyle remains the foundation for treating the symptoms of reflux.  The following strategies help you prevent heartburn and other symptoms by avoiding foods that reduce the effectiveness of the bottom of the esophagus from protecting the esophagus from from acid injury and keeping stomach contents where they belong.  Eat smaller meals. A large meal remains in the stomach for several hours, increasing the chances for gastroesophageal reflux. Try distributing your daily food intake over three, four, or five smaller meals.  Relax when you eat. Stress increases the production of stomach acid, so make meals a pleasant, relaxing experience. Sit down. Eat slowly. Chew completely. Play soothing music.  Relax between meals. Relaxation therapies such as deep breathing, meditation, massage, tai chi, or yoga may help prevent and relieve heartburn.   Remain upright after eating. You should maintain postures that reduce the risk for reflux for at least three hours after eating. For example, don't bend over or strain to lift heavy objects.  Avoid eating within three hours of going to bed. Lying down after eating will increase chances of reflux.  Lose weight. Excess pounds increase pressure on the stomach and can push acid into the esophagus.  Loosen up. Avoid tight belts, waistbands, and other clothing that puts pressure on your stomach.  Avoid foods that burn. Abstain from food or drink that increases gastric acid secretion, decreases the valve at the bottom of the esophagus, or slows the emptying of the stomach. Known offenders include high-fat foods, spicy dishes, tomatoes and tomato products, citrus fruits, garlic, onions, milk, carbonated drinks, coffee (including decaf), tea, chocolate, mints, and alcohol.  Avoid medications that can predispose you to reflux including aspirin  and other NSAIDs, oral contraceptives, hormone therapy drugs, and certain antidepressants.  Sleep at an angle. If you're bothered by nighttime heartburn, place a wedge (available in medical supply stores or a wedge pillow through [employer]) under your upper body. But don't elevate your head with extra pillows. That makes reflux worse by bending you at the waist and compressing your stomach. You might also try sleeping on your left side, as studies have shown this can help--perhaps because the stomach is on the left side of the body, so lying on your left positions most of the stomach below the bottom of the esophagus.   Please let me know if you like to make an appointment with Dr. Bryan Lemma to discuss procedures to minimize the long-term need for medications for reflux.  We briefly discussed fatty liver. Unfortunately, there are no FDA-approved medications for fatty liver disease.   The good news is that the most effective treatment so far for fatty liver disease does not involve medications, but rather lifestyle changes. The bad news is that these are typically hard to achieve and maintain for many people. Here's what we know helps: - It appears that aerobic exercise also leads to decreased fat in the liver, and with vigorous intensity, possibly also decreased inflammation independent of weight loss. - Eat well. Some studies suggest that the Mediterranean diet may also decrease the fat in the liver. This nutrition plan emphasizes fruits, vegetables, whole grains, legumes, nuts, replacing butter with olive or canola oil, limiting red meat, and eating more fish and lean poultry. Shopping the periphery of the grocery store will be much healthier. High fructose corn syrup is associated with fatty liver -  so please avoid this as able.  - Drink coffee, maybe? Some studies showed that patients with NAFLD who drank coffee (about two cups every day) had a decreased risk in fibrosis. However, take into  consideration the downsides of regular caffeine intake.  Please reach out if you need anything in the future.

## 2021-12-06 NOTE — Progress Notes (Signed)
Referring Provider: [facility]it* Primary Care Physician:  [facility] [place]  Chief Complaint: Reflux   IMPRESSION:  Reflux esophagitis by EGD, improved on famotidine Concern for dental injury related to her prolonged reflux  Concern for long term medications as recommended by her dentist Fatty liver on ultrasound with patient reported normal ultrasound with risks for chronic viral hepatitis   PLAN: - Reviewed reflux lifestyle modifications include nonprescription therapies for managing reflux (see patient instructions) - Continue famotidine 20 mg BID as needed - Briefly discussed TIF, brochure provided, she may schedule consultation with Dr. Barron Alvine if she would like additional information - Reviewed natural history of fatty liver and treatment recommendations (see patient instructions) - Follow-up at least annually, earlier if needed  HPI: Lindsey Hess is a 31 y.o. female initially referred by [facility] for further evaluation of reflux.  She was initially seen in consultation 07/21/2021 and had an upper endoscopy 10/13/2021.  The interval history is obtained through the patient review of her electronic health record.  She reports a history of IBS, C-section in 2014, and a heart murmur.  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   Consultation 07/21/2020 she reported a longstanding history of heartburn.  Previously seen by Dr. Conley Rolls in 2020 for reflux.  He recommended PPI therapy.  Pyrosis is worse after eating spicy foods.  She found that symptoms improved by avoiding trigger foods.  Symptoms worsened over the last year without identifying reason for exacerbation. Symptoms are now occurring approximately 5-7 days a week.  Frequently awakes with symptoms in the morning.  No alarm features.  Avoid spicy and greasy foods. She has been working with a nutritionist. Eating gluten causes severe gas. With dietary changes she has lost 5  pounds. She has been as low as 89 pounds when the symptoms are most severe.  At the time of a recent dental visit her dentist expressed concerns about damage happening to her teeth due to uncontrolled reflux.  Previously treated with omeprazole and famotidine. She has concerns about long-term side effects and finds they are bandaids and not treatments. She has tried HCL when helped. Licorice root did not make a difference.   In 2020 H. pylori IgG negative.  Celiac serologies were negative.  Abdominal ultrasound 08/04/2021 showed mild hepatic steatosis but was otherwise normal  HIDA with CCK 09/13/2021 was normal.  Gallbladder ejection fraction 78%.  EGD 10/13/2021 showed LA grade a reflux esophagitis.  Esophageal biopsies were consistent with reflux.  There was no EOE.  Gastric biopsy showed reactive gastropathy.  Duodenal biopsies were normal.  12/06/21 returns in follow-up after endoscopy taking famotidine 20 mg BID. She is feeling better on famotidine but has concerns about long term risks.  She reports normal liver enzymes through her PCP at [facility] last year.   Past Medical History:  Diagnosis Date   GERD (gastroesophageal reflux disease)    Heart murmur    IBS (irritable bowel syndrome)    Medical history non-contributory    Status post cesarean section 08/28/2012   Breech presentation    Past Surgical History:  Procedure Laterality Date   CESAREAN SECTION N/A 08/28/2012   Procedure: CESAREAN SECTION;  Surgeon: Purcell Nails, MD;  Location: [facility];  Service: Obstetrics;  Laterality: N/A;   NO PAST SURGERIES     WISDOM TOOTH EXTRACTION        Current Outpatient Medications  Medication Sig Dispense Refill  cholecalciferol (VITAMIN D3) 25 MCG (1000 UNIT) tablet Take 1,000 Units by mouth daily.     famotidine (PEPCID) 20 MG tablet Take 1 tablet (20 mg total) by mouth 2 (two) times daily. 60 tablet 1   Multiple Vitamin (MULTIVITAMIN) capsule Take 1 capsule by mouth daily.      omega-3 acid ethyl esters (LOVAZA) 1 g capsule Take 1 capsule by mouth daily.     No current facility-administered medications for this visit.    Allergies as of 12/06/2021   (No Known Allergies)    Family History  Problem Relation Age of Onset   Diabetes Maternal Grandmother    Cancer Paternal Grandmother        unknown   Stomach cancer Neg Hx    Colon cancer Neg Hx    Esophageal cancer Neg Hx       Physical Exam: Gen: Awake, alert, and oriented, and well communicative. HEENT: EOMI, non-icteric sclera, NCAT, MMM  Neck: Normal movement of head and neck  Pulm: No labored breathing, speaking in full sentences without conversational dyspnea  Derm: No apparent lesions or bruising in visible field  MS: Moves all visible extremities without noticeable abnormality  Psych: Pleasant, cooperative, normal speech, thought processing seemingly intact     Lisbet Busker L. Tarri Glenn, MD, MPH 12/06/2021, 2:52 PM

## 2022-01-09 DIAGNOSIS — R69 Illness, unspecified: Secondary | ICD-10-CM | POA: Diagnosis not present

## 2022-01-09 DIAGNOSIS — R0789 Other chest pain: Secondary | ICD-10-CM | POA: Diagnosis not present

## 2022-01-09 DIAGNOSIS — Z681 Body mass index (BMI) 19 or less, adult: Secondary | ICD-10-CM | POA: Diagnosis not present

## 2022-02-02 ENCOUNTER — Ambulatory Visit: Payer: 59 | Admitting: Cardiology

## 2022-02-02 ENCOUNTER — Encounter: Payer: Self-pay | Admitting: Cardiology

## 2022-02-02 VITALS — BP 102/53 | HR 69 | Resp 16 | Ht 62.0 in | Wt 92.0 lb

## 2022-02-02 DIAGNOSIS — R002 Palpitations: Secondary | ICD-10-CM | POA: Diagnosis not present

## 2022-02-02 DIAGNOSIS — R072 Precordial pain: Secondary | ICD-10-CM | POA: Insufficient documentation

## 2022-02-02 NOTE — Progress Notes (Unsigned)
    Patient referred by [facility] for chest pain, palpitations  Subjective:   Lindsey Hess, female    DOB: [birth year], 31 y.o.   MRN: [id number]   No chief complaint on file.   *** HPI  31 y.o. *** female with ***  *** Past Medical History:  Diagnosis Date   GERD (gastroesophageal reflux disease)    Heart murmur    IBS (irritable bowel syndrome)    Medical history non-contributory    Status post cesarean section 08/28/2012   Breech presentation    *** Past Surgical History:  Procedure Laterality Date   CESAREAN SECTION N/A 08/28/2012   Procedure: CESAREAN SECTION;  Surgeon: Delice Lesch, MD;  Location: [facility];  Service: Obstetrics;  Laterality: N/A;   NO PAST SURGERIES     WISDOM TOOTH EXTRACTION      *** Social History   Tobacco Use  Smoking Status Never  Smokeless Tobacco Never    Social History   Substance and Sexual Activity  Alcohol Use No    *** Family History  Problem Relation Age of Onset   Diabetes Maternal Grandmother    Cancer Paternal Grandmother        unknown   Stomach cancer Neg Hx    Colon cancer Neg Hx    Esophageal cancer Neg Hx     ***  Current Outpatient Medications:    cholecalciferol (VITAMIN D3) 25 MCG (1000 UNIT) tablet, Take 1,000 Units by mouth daily., Disp: , Rfl:    famotidine (PEPCID) 20 MG tablet, Take 1 tablet (20 mg total) by mouth 2 (two) times daily., Disp: 180 tablet, Rfl: 3   Multiple Vitamin (MULTIVITAMIN) capsule, Take 1 capsule by mouth daily., Disp: , Rfl:    omega-3 acid ethyl esters (LOVAZA) 1 g capsule, Take 1 capsule by mouth daily., Disp: , Rfl:    Cardiovascular and other pertinent studies:  Reviewed external labs and tests, independently interpreted  EKG 02/02/2022: Sinus rhythm 58 bpm rSR pattern, non diagnostic  ***  *** Recent labs: ***/***/202***: Glucose ***, BUN/Cr ***/***. EGFR ***. Na/K ***/***. ***Rest of the CMP normal H/H ***/***. MCV ***. Platelets  *** ***HbA1C ***% Chol ***, TG ***, HDL ***, LDL *** ***TSH ***normal   *** ROS      *** Vitals:   02/02/22 1258  BP: (!) 102/53  Pulse: 69  Resp: 16  SpO2: 97%     Body mass index is 16.83 kg/m. Filed Weights   02/02/22 1258  Weight: 92 lb (41.7 kg)    *** Objective:   Physical Exam    ***     Visit diagnoses: No diagnosis found.   No orders of the defined types were placed in this encounter.    Medication changes this visit: There are no discontinued medications.  No orders of the defined types were placed in this encounter.    Assessment & Recommendations:   ***  ***  Thank you for referring the patient to [place]. Please feel free to contact with any questions.   Nigel Mormon, MD Pager: [phone] Office: [phone]

## 2022-02-03 ENCOUNTER — Encounter: Payer: Self-pay | Admitting: Cardiology

## 2022-02-23 ENCOUNTER — Ambulatory Visit: Payer: 59

## 2022-02-23 DIAGNOSIS — R072 Precordial pain: Secondary | ICD-10-CM

## 2022-03-02 ENCOUNTER — Ambulatory Visit: Payer: 59

## 2022-03-02 DIAGNOSIS — R072 Precordial pain: Secondary | ICD-10-CM | POA: Diagnosis not present

## 2022-04-17 ENCOUNTER — Telehealth: Payer: Self-pay | Admitting: Gastroenterology

## 2022-04-17 DIAGNOSIS — R1013 Epigastric pain: Secondary | ICD-10-CM

## 2022-04-17 DIAGNOSIS — R12 Heartburn: Secondary | ICD-10-CM

## 2022-04-17 MED ORDER — FAMOTIDINE 20 MG PO TABS: 20.0000 mg | ORAL_TABLET | Freq: Two times a day (BID) | ORAL | 3 refills | Status: AC

## 2022-04-17 NOTE — Telephone Encounter (Signed)
Inbound call from patient requesting a refill for  famotidine  .Please advise

## 2022-04-17 NOTE — Telephone Encounter (Signed)
Script sent to pharmacy.

## 2022-06-08 DIAGNOSIS — Z13220 Encounter for screening for lipoid disorders: Secondary | ICD-10-CM | POA: Diagnosis not present

## 2022-06-08 DIAGNOSIS — Z114 Encounter for screening for human immunodeficiency virus [HIV]: Secondary | ICD-10-CM | POA: Diagnosis not present

## 2022-06-08 DIAGNOSIS — Z0000 Encounter for general adult medical examination without abnormal findings: Secondary | ICD-10-CM | POA: Diagnosis not present

## 2022-10-30 DIAGNOSIS — F411 Generalized anxiety disorder: Secondary | ICD-10-CM | POA: Diagnosis not present

## 2022-10-30 DIAGNOSIS — D708 Other neutropenia: Secondary | ICD-10-CM | POA: Diagnosis not present

## 2022-10-30 DIAGNOSIS — Z681 Body mass index (BMI) 19 or less, adult: Secondary | ICD-10-CM | POA: Diagnosis not present

## 2023-01-04 DIAGNOSIS — F411 Generalized anxiety disorder: Secondary | ICD-10-CM | POA: Diagnosis not present

## 2023-01-04 DIAGNOSIS — Z681 Body mass index (BMI) 19 or less, adult: Secondary | ICD-10-CM | POA: Diagnosis not present

## 2023-05-10 DIAGNOSIS — H53022 Refractive amblyopia, left eye: Secondary | ICD-10-CM | POA: Diagnosis not present

## 2023-05-10 DIAGNOSIS — H40053 Ocular hypertension, bilateral: Secondary | ICD-10-CM | POA: Diagnosis not present

## 2023-08-02 DIAGNOSIS — Z0000 Encounter for general adult medical examination without abnormal findings: Secondary | ICD-10-CM | POA: Diagnosis not present

## 2023-09-11 ENCOUNTER — Ambulatory Visit: Admitting: Podiatry

## 2023-09-11 ENCOUNTER — Ambulatory Visit (INDEPENDENT_AMBULATORY_CARE_PROVIDER_SITE_OTHER)

## 2023-09-11 ENCOUNTER — Encounter: Payer: Self-pay | Admitting: Podiatry

## 2023-09-11 VITALS — Ht 62.0 in | Wt 92.0 lb

## 2023-09-11 DIAGNOSIS — M7751 Other enthesopathy of right foot: Secondary | ICD-10-CM | POA: Diagnosis not present

## 2023-09-11 DIAGNOSIS — S93401A Sprain of unspecified ligament of right ankle, initial encounter: Secondary | ICD-10-CM | POA: Diagnosis not present
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
## 2023-12-29 DIAGNOSIS — R42 Dizziness and giddiness: Secondary | ICD-10-CM | POA: Diagnosis not present

## 2024-02-26 IMAGING — US US ABDOMEN COMPLETE
1 series · 15 of 25 positions shown · non-contrast
Comparison: None Available.

CLINICAL DATA: Postprandial epigastric pain

EXAM:
ABDOMEN ULTRASOUND COMPLETE

[Series 1: us abdomen complete mc & wl · 15 of 107 slices shown]
[im 1/107]
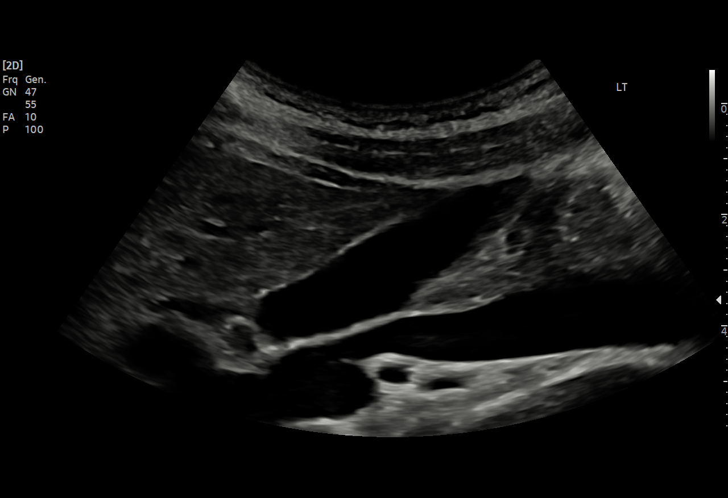
[im 9/107]
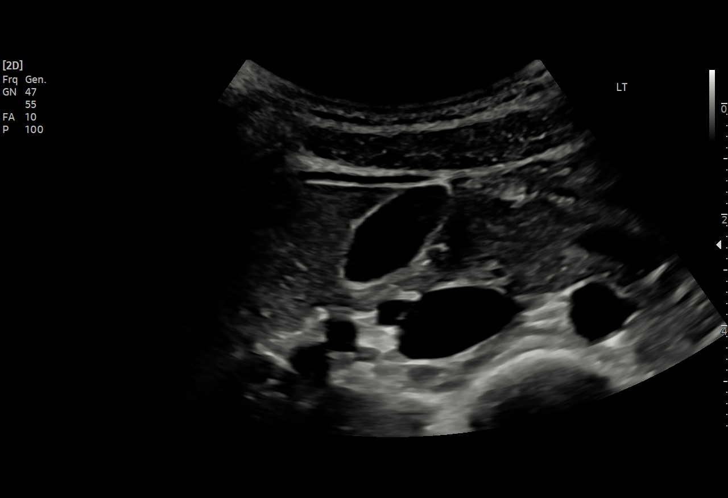
[im 18/107]
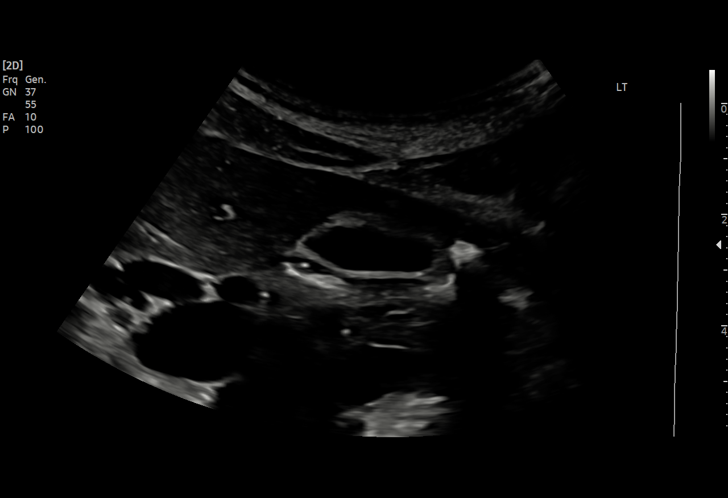
[im 23/107]
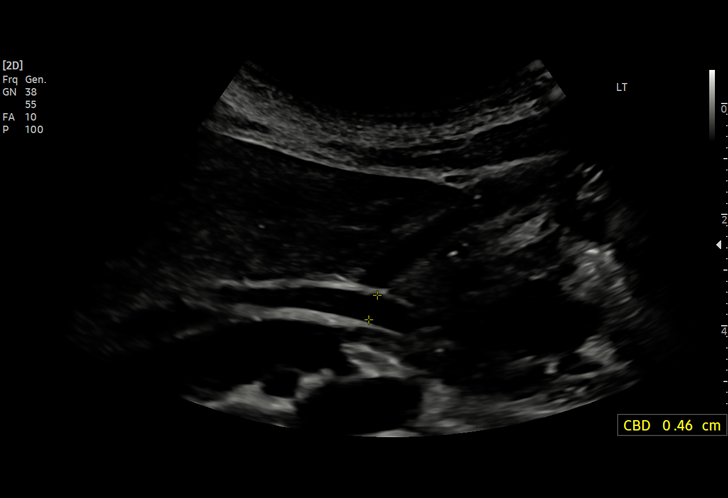
[im 31/107]
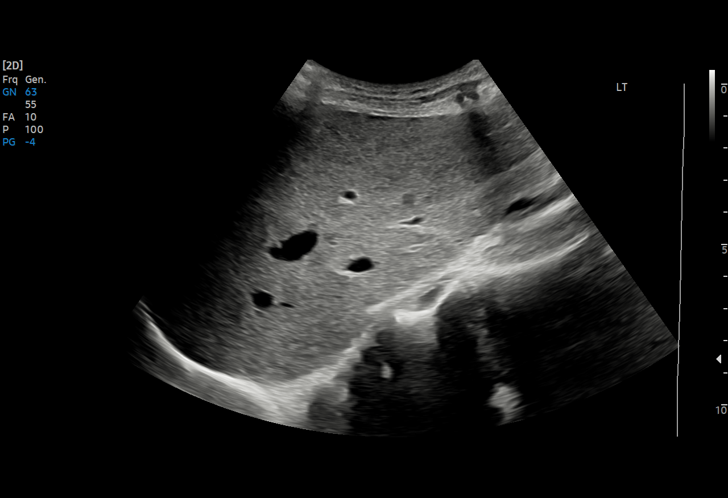
[im 40/107]
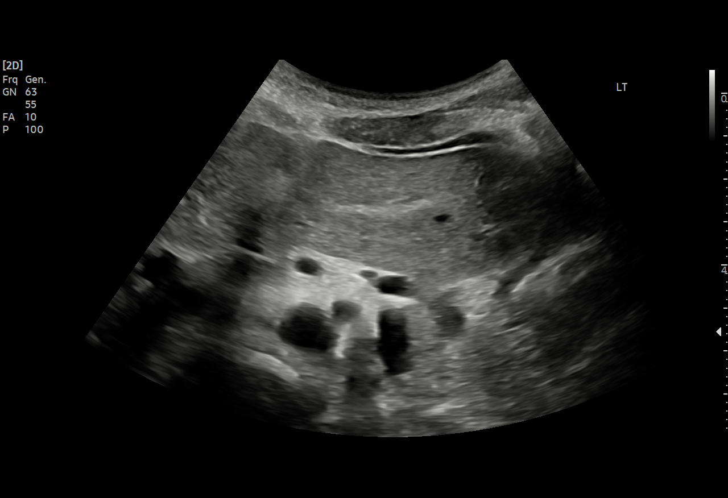
[im 45/107]
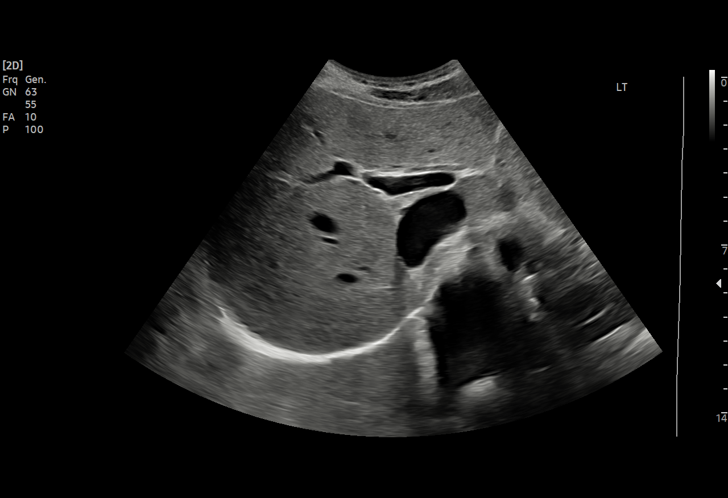
[im 54/107]
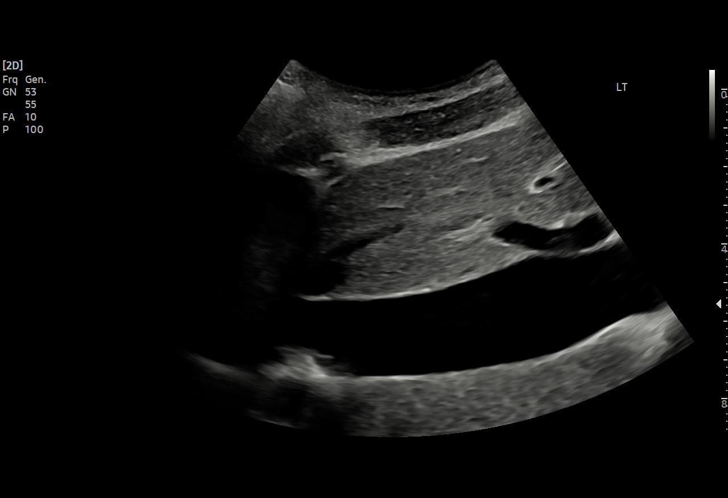
[im 62/107]
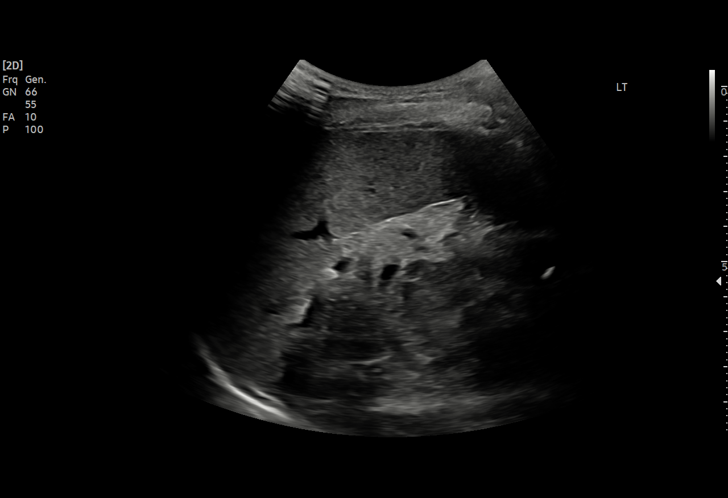
[im 67/107]
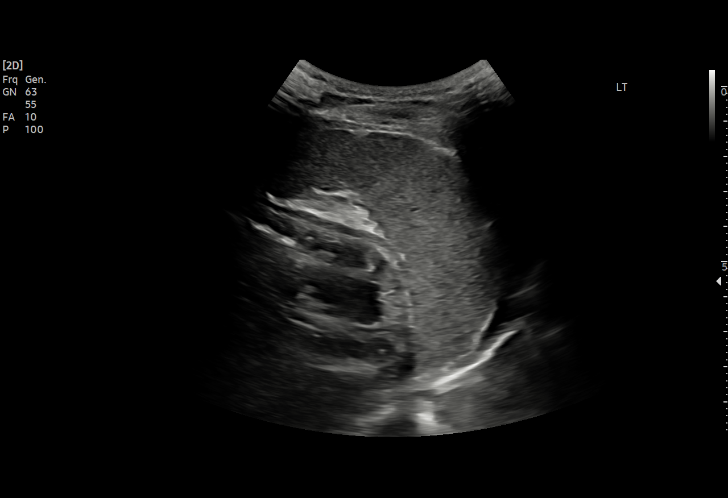
[im 76/107]
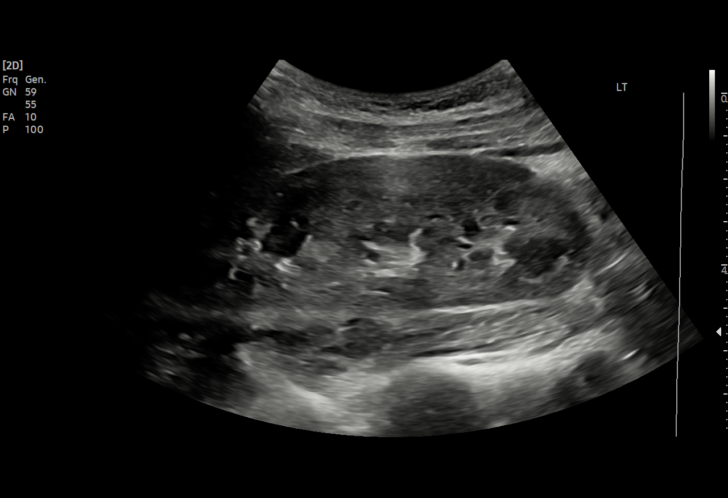
[im 84/107]
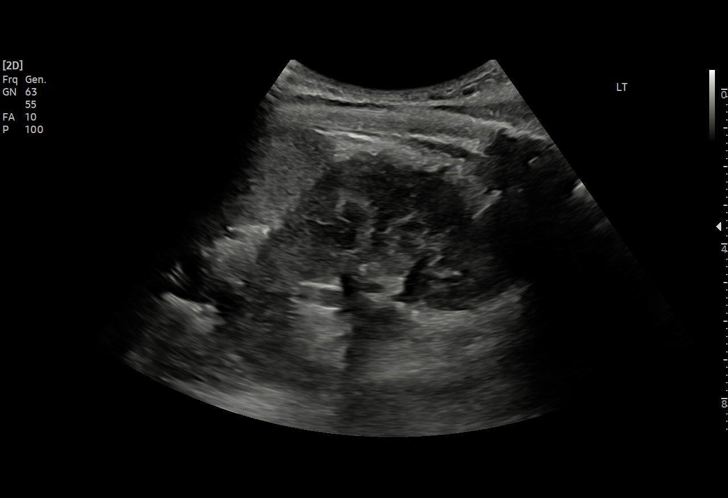
[im 89/107]
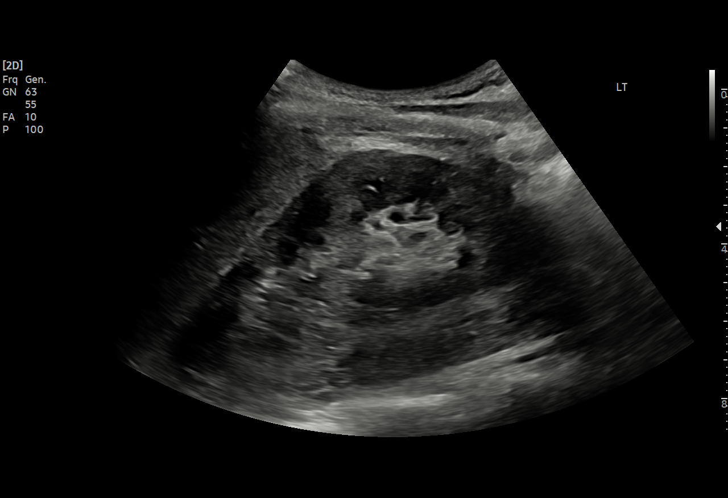
[im 98/107]
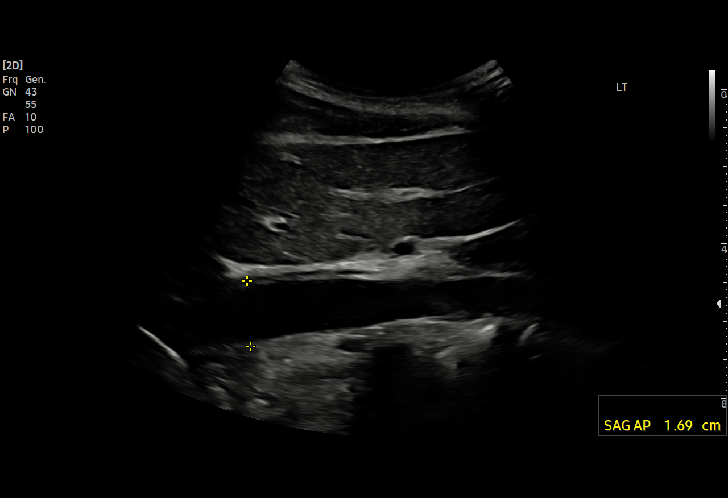
[im 107/107]
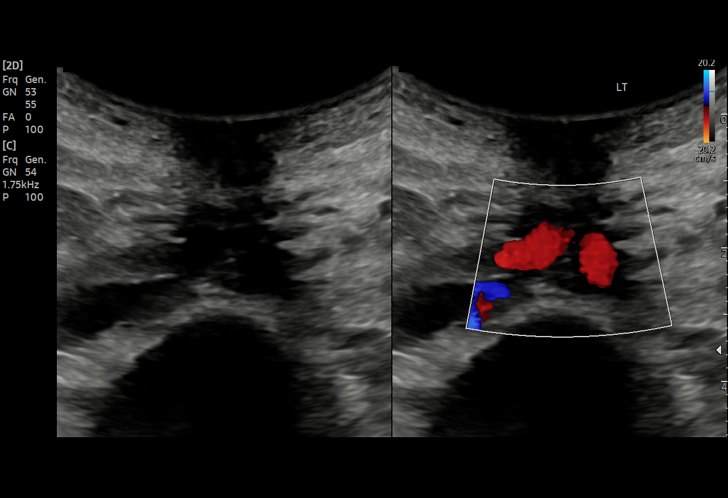

[15 of 25 positions shown; findings below may reference images not displayed]

FINDINGS: Gallbladder: No gallstones or wall thickening visualized. No
sonographic Murphy sign noted by sonographer.

Common bile duct: Diameter: 4 mm

Liver: Mild increased echogenicity. No focal lesion. Portal vein is
patent on color Doppler imaging with normal direction of blood flow
towards the liver.

IVC: No abnormality visualized.

Pancreas: Visualized portion unremarkable.

Spleen: Size and appearance within normal limits.

Right Kidney: Length: 10.7 cm. Echogenicity within normal limits. No
mass or hydronephrosis visualized.

Left Kidney: Length: 10.5 cm. Echogenicity within normal limits. No
mass or hydronephrosis visualized.

Abdominal aorta: No aneurysm visualized.

Other findings: None.
IMPRESSION: Mild hepatic steatosis

No cholelithiasis or sonographic evidence for acute cholecystitis.
